# Patient Record
Sex: Male | Born: 1975 | Race: White | Hispanic: No | Marital: Married | Smoking: Current every day smoker
Health system: Southern US, Community
[De-identification: ages and names within clinical notes are randomized; demographics above are authoritative.]

## PROBLEM LIST (undated history)

## (undated) DIAGNOSIS — I10 Essential (primary) hypertension: Secondary | ICD-10-CM

## (undated) HISTORY — DX: Essential (primary) hypertension: I10

---

## 2012-10-17 ENCOUNTER — Ambulatory Visit: Payer: Self-pay | Admitting: Internal Medicine

## 2012-12-27 ENCOUNTER — Ambulatory Visit (INDEPENDENT_AMBULATORY_CARE_PROVIDER_SITE_OTHER): Payer: BC Managed Care – PPO | Admitting: Adult Health

## 2012-12-27 ENCOUNTER — Encounter: Payer: Self-pay | Admitting: Adult Health

## 2012-12-27 VITALS — BP 146/86 | HR 88 | Temp 98.4°F | Resp 12 | Ht 68.75 in | Wt 196.0 lb

## 2012-12-27 DIAGNOSIS — R030 Elevated blood-pressure reading, without diagnosis of hypertension: Secondary | ICD-10-CM | POA: Insufficient documentation

## 2012-12-27 DIAGNOSIS — Z0000 Encounter for general adult medical examination without abnormal findings: Secondary | ICD-10-CM

## 2012-12-27 DIAGNOSIS — M2550 Pain in unspecified joint: Secondary | ICD-10-CM

## 2012-12-27 NOTE — Assessment & Plan Note (Signed)
Patient with slightly elevated B/P. He has not had PCP in years. White coat syndrome (?). Continue to monitor. Check labs. Will need to follow up; however, pt reports schedule extremely busy at work and doesn't have much time. He also reports no where to check his b/p although multiple pharmacies have this service.

## 2012-12-27 NOTE — Assessment & Plan Note (Signed)
Has multiple joint aches and pains. His work is not physically active. Not currently working out. Reports of his grandmother having arthritis at a young age. Will check ANA, crp, sed rate.

## 2012-12-27 NOTE — Patient Instructions (Addendum)
   Thank you for choosing [facility] at [facility] for your health care needs.  Please schedule appointment to have your labs drawn.  The results will be available through MyChart for your convenience. Please remember to activate this. The activation code is located at the end of this form.

## 2012-12-27 NOTE — Assessment & Plan Note (Signed)
Normal physical exam except for problem listed separately. Check labs: cbc w/diff, bmet, hepatic panel and lipids.

## 2012-12-27 NOTE — Progress Notes (Signed)
  Subjective:    Patient ID: Anthony Stewart, male    DOB: 05/08/1975, 37 y.o.   MRN: [id number]  HPI  Patient presents to clinic to establish care. He has not had a PCP since he was seen by his Pediatrician. He would obtain medical care as needed at Urgent Care Clinics. He reports he is here because his wife insisted. He does report having joint pains. His work is not physical so he does not feel it is due to overuse or repetitive movements. He states that his grandmother had arthritis at a very young age (27).    Past Medical History  Diagnosis Date  . Hypertension     No past surgical history on file.   Family History  Problem Relation Age of Onset  . Family history unknown: Yes    History   Social History  . Marital Status: Married    Spouse Name: N/A    Number of Children: N/A  . Years of Education: N/A   Occupational History  . Not on file.   Social History Main Topics  . Smoking status: Former Games developer  . Smokeless tobacco: Never Used  . Alcohol Use: Yes  . Drug Use: No  . Sexual Activity: Not on file   Other Topics Concern  . Not on file   Social History Narrative  . No narrative on file    Review of Systems  Constitutional: Negative.   HENT: Negative.   Eyes: Negative.   Respiratory: Negative.   Cardiovascular: Negative.   Gastrointestinal: Negative.   Endocrine: Negative.   Genitourinary: Negative.   Musculoskeletal:       Multiple joint pains  Skin: Negative.   Allergic/Immunologic: Negative.   Neurological: Negative.   Hematological: Negative.   Psychiatric/Behavioral: Negative.     BP 146/86  Pulse 88  Temp(Src) 98.4 F (36.9 C) (Oral)  Resp 12  Ht 5' 8.75" (1.746 m)  Wt 196 lb (88.905 kg)  BMI 29.16 kg/m2  SpO2 98%    Objective:   Physical Exam  Constitutional: He is oriented to person, place, and time. He appears well-developed and well-nourished. No distress.  HENT:  Head: Normocephalic and atraumatic.  Right Ear: External  ear normal.  Left Ear: External ear normal.  Nose: Nose normal.  Mouth/Throat: Oropharynx is clear and moist.  Eyes: Conjunctivae and EOM are normal. Pupils are equal, round, and reactive to light.  Neck: Normal range of motion. Neck supple.  Cardiovascular: Normal rate, regular rhythm, normal heart sounds and intact distal pulses.   Pulmonary/Chest: Effort normal and breath sounds normal. No respiratory distress. He has no wheezes. He has no rales.  Abdominal: Soft. Bowel sounds are normal. He exhibits no distension and no mass. There is no tenderness. There is no rebound and no guarding.  Musculoskeletal: Normal range of motion.  Lymphadenopathy:    He has no cervical adenopathy.  Neurological: He is alert and oriented to person, place, and time.  Skin: Skin is warm and dry.     Psychiatric: He has a normal mood and affect. His behavior is normal. Judgment and thought content normal.        Assessment & Plan:

## 2012-12-29 ENCOUNTER — Other Ambulatory Visit (INDEPENDENT_AMBULATORY_CARE_PROVIDER_SITE_OTHER): Payer: BC Managed Care – PPO

## 2012-12-29 DIAGNOSIS — Z0000 Encounter for general adult medical examination without abnormal findings: Secondary | ICD-10-CM

## 2012-12-29 DIAGNOSIS — M2550 Pain in unspecified joint: Secondary | ICD-10-CM

## 2012-12-29 LAB — BASIC METABOLIC PANEL
BUN: 11 mg/dL (ref 6–23)
CO2: 27 mEq/L (ref 19–32)
Chloride: 106 mEq/L (ref 96–112)
Potassium: 4.4 mEq/L (ref 3.5–5.1)

## 2012-12-29 LAB — CBC WITH DIFFERENTIAL/PLATELET
Basophils Absolute: 0.1 10*3/uL (ref 0.0–0.1)
Basophils Relative: 1.2 % (ref 0.0–3.0)
Eosinophils Relative: 3.8 % (ref 0.0–5.0)
HCT: 45.3 % (ref 39.0–52.0)
Hemoglobin: 15.6 g/dL (ref 13.0–17.0)
Lymphocytes Relative: 35.4 % (ref 12.0–46.0)
Lymphs Abs: 1.7 10*3/uL (ref 0.7–4.0)
Monocytes Relative: 8.4 % (ref 3.0–12.0)
Neutro Abs: 2.5 10*3/uL (ref 1.4–7.7)
RBC: 4.98 Mil/uL (ref 4.22–5.81)
WBC: 4.9 10*3/uL (ref 4.5–10.5)

## 2012-12-29 LAB — LIPID PANEL
HDL: 59.6 mg/dL (ref >39.00)
Total CHOL/HDL Ratio: 4
VLDL: 42 mg/dL — ABNORMAL HIGH (ref 0.0–40.0)

## 2012-12-29 LAB — HEPATIC FUNCTION PANEL
Bilirubin, Direct: 0.1 mg/dL (ref 0.0–0.3)
Total Protein: 7.4 g/dL (ref 6.0–8.3)

## 2012-12-29 LAB — C-REACTIVE PROTEIN: CRP: <0.5 mg/dL (ref 0.5–20.0)

## 2012-12-29 LAB — LDL CHOLESTEROL, DIRECT: Direct LDL: 171.2 mg/dL

## 2013-01-02 ENCOUNTER — Encounter: Payer: Self-pay | Admitting: *Deleted

## 2013-02-23 ENCOUNTER — Other Ambulatory Visit: Payer: Self-pay

## 2013-03-01 ENCOUNTER — Ambulatory Visit: Payer: BC Managed Care – PPO | Admitting: Adult Health

## 2013-03-06 ENCOUNTER — Ambulatory Visit: Payer: BC Managed Care – PPO | Admitting: Adult Health

## 2013-03-09 ENCOUNTER — Encounter: Payer: Self-pay | Admitting: Adult Health

## 2013-03-09 ENCOUNTER — Ambulatory Visit (INDEPENDENT_AMBULATORY_CARE_PROVIDER_SITE_OTHER): Payer: BC Managed Care – PPO | Admitting: Adult Health

## 2013-03-09 VITALS — BP 124/80 | HR 84 | Resp 14 | Wt 199.0 lb

## 2013-03-09 DIAGNOSIS — M25511 Pain in right shoulder: Secondary | ICD-10-CM | POA: Insufficient documentation

## 2013-03-09 DIAGNOSIS — M25519 Pain in unspecified shoulder: Secondary | ICD-10-CM

## 2013-03-09 DIAGNOSIS — G8929 Other chronic pain: Secondary | ICD-10-CM | POA: Insufficient documentation

## 2013-03-09 NOTE — Progress Notes (Signed)
Pre visit review using our clinic review tool, if applicable. No additional management support is needed unless otherwise documented below in the visit note. 

## 2013-03-09 NOTE — Progress Notes (Signed)
  Subjective:    Patient ID: Anthony Stewart, male    DOB: 09/23/1975, 37 y.o.   MRN: [id number]  HPI  Pt is a pleasant 37 y/o male who presents to clinic with bil. Shoulder pain. Pain is worse with raising arms anteriorly. Does not exhibit as much pain with abduction laterally. He reports having bursitis in the past however he states this pain feels different.   Review of Systems  Musculoskeletal: Positive for arthralgias. Negative for joint swelling.       Pain bil. shoulders       Objective:   Physical Exam  Constitutional: He is oriented to person, place, and time.  Musculoskeletal: He exhibits tenderness. He exhibits no edema.  Pain anterior shoulder. Pain also with movement of raising arm anteriorly. Discomfort with abduction laterally.  Neurological: He is alert and oriented to person, place, and time.  Psychiatric: He has a normal mood and affect. His behavior is normal. Judgment and thought content normal.          Assessment & Plan:

## 2013-03-09 NOTE — Assessment & Plan Note (Signed)
?   Tendonitis. Ref to ortho.

## 2013-03-17 ENCOUNTER — Ambulatory Visit (INDEPENDENT_AMBULATORY_CARE_PROVIDER_SITE_OTHER): Payer: BC Managed Care – PPO | Admitting: Family Medicine

## 2013-03-17 ENCOUNTER — Encounter: Payer: Self-pay | Admitting: Family Medicine

## 2013-03-17 VITALS — BP 140/96 | HR 84 | Temp 98.1°F | Ht 68.75 in | Wt 197.0 lb

## 2013-03-17 DIAGNOSIS — M75100 Unspecified rotator cuff tear or rupture of unspecified shoulder, not specified as traumatic: Secondary | ICD-10-CM

## 2013-03-17 DIAGNOSIS — M5412 Radiculopathy, cervical region: Secondary | ICD-10-CM

## 2013-03-17 DIAGNOSIS — G2589 Other specified extrapyramidal and movement disorders: Secondary | ICD-10-CM

## 2013-03-17 DIAGNOSIS — M67919 Unspecified disorder of synovium and tendon, unspecified shoulder: Secondary | ICD-10-CM

## 2013-03-17 DIAGNOSIS — M5010 Cervical disc disorder with radiculopathy, unspecified cervical region: Secondary | ICD-10-CM

## 2013-03-17 DIAGNOSIS — M7542 Impingement syndrome of left shoulder: Secondary | ICD-10-CM

## 2013-03-17 DIAGNOSIS — M7582 Other shoulder lesions, left shoulder: Secondary | ICD-10-CM

## 2013-03-17 DIAGNOSIS — M7552 Bursitis of left shoulder: Secondary | ICD-10-CM

## 2013-03-17 DIAGNOSIS — R279 Unspecified lack of coordination: Secondary | ICD-10-CM

## 2013-03-17 NOTE — Progress Notes (Signed)
Pre-visit discussion using our clinic review tool. No additional management support is needed unless otherwise documented below in the visit note.  

## 2013-03-17 NOTE — Patient Instructions (Signed)
REFERRAL: GO THE THE FRONT ROOM AT THE ENTRANCE OF OUR CLINIC, NEAR CHECK IN. ASK FOR MARION. SHE WILL HELP YOU SET UP YOUR REFERRAL. DATE: TIME:   Look up on Youtube  McKenzie Protocol Cervical Spine   Advice about weightlifting with SHOULDER PROBLEMS:  1. Avoid overhead presses, military presses, clean and jerk, snatch, heavy incline bench presses, heavy bench presses, any kind of chest fly. Heavy lateral raises will hurt.  ALL OF THESE MAKE THE ROTATOR CUFF IMPINGE ON THE BONY ANATOMY ABOVE THEM AND OFTEN CAUSE RECURRENT BURSITIS AND ROTATOR CUFF TENDINITIS.  2. If you have to do bench presses then I would do the decline press and not lock out at the end of the lift. DUMBBELLS WILL TAKE STRESS OFF THE SHOULDER, SO I RECOMMEND SWITCHING FROM BAR PRESSES TO DUMBBELLS.  Continue with rotator cuff strengthening and scapular stabilizaton. Recommended that you could do all aerobic actvity.  3. All pulls, lat pull downs, pull-ups, mid-back low back, arms should be no problem. 4. Core is fine

## 2013-03-17 NOTE — Progress Notes (Signed)
Date:  03/17/2013   Name:  Anthony Stewart   DOB:  [birth year]   MRN:  [id number] Gender: male Age: 37 y.o.  Primary Physician: Orville Govern, NP  Dear Ms. Rey,  Thank you for having me see Anthony Stewart in consultation today at [facility] at [facility] for his problem with bilateral shoulder pain and radicular symptoms on the left.  As you may recall, he is a 37 y.o. year old male with a history of left greater than right shoulder pain that is ongoing now for 2 years. Over the last few months, he is also additionally started to develop some tingling, and radicular symptoms down his left arm including some numbness and tingling mostly in the first and second digit region and in the median nerve distribution on his arm.  He does not describe any particular injury. He works on Geophysical data processor for a living. He primarily is having pain when he is moving his arms and abduction, and when doing pressing movements. He does work out and Research officer, political party. He works out anywhere from 0-5 days per week. He is tried to alter his lifts in the waiting room.  He has taken some Motrin intermittently and rarely. He has not done any formal physical therapy. He has not had any prior operative interventions in the affected shoulders. He has never had any cervical spine surgery. He has never had a manipulation or other alternative treatments.   Past Medical History  Diagnosis Date  . Hypertension     No past surgical history on file.  History   Social History  . Marital Status: Married    Spouse Name: N/A    Number of Children: N/A  . Years of Education: N/A   Social History Main Topics  . Smoking status: Former Games developer  . Smokeless tobacco: Never Used  . Alcohol Use: Yes  . Drug Use: No  . Sexual Activity: None   Other Topics Concern  . None   Social History Narrative  . None    No family history on file.  Medications and Allergies reviewed  Review of Systems:    GEN: No fevers, chills.  Nontoxic. Primarily MSK c/o today. MSK: Detailed in the HPI GI: tolerating PO intake without difficulty Neuro: No numbness, parasthesias, or tingling associated. Otherwise the pertinent positives of the ROS are noted above.   Physical Examination: BP 140/96  Pulse 84  Temp(Src) 98.1 F (36.7 C) (Oral)  Ht 5' 8.75" (1.746 m)  Wt 197 lb (89.359 kg)  BMI 29.31 kg/m2    GEN: Well-developed,well-nourished,in no acute distress; alert,appropriate and cooperative throughout examination HEENT: Normocephalic and atraumatic without obvious abnormalities. Ears, externally no deformities PULM: Breathing comfortably in no respiratory distress EXT: No clubbing, cyanosis, or edema PSYCH: Normally interactive. Cooperative during the interview. Pleasant. Friendly and conversant. Not anxious or depressed appearing. Normal, full affect.  CERVICAL SPINE EXAM Range of motion: Flexion, extension, lateral bending, and rotation: Relatively preserved with some loss of motion moving leftward. Pain with terminal motion: Mildly with terminal motion, more to the left and lateral bending and rotation Spinous Processes: NT SCM: NT Upper paracervical muscles: mildly TTP Upper traps: NT C5-T1 intact, sensation and motor Spurling's test is positive to the LEFT  Shoulder: Bilateral Inspection: No muscle wasting or winging Ecchymosis/edema: neg  AC joint, scapula, clavicle: NT Abduction:  5/5, painful arc of motion and markedly altered mechanics Flexion: full, 5/5 IR, full, lift-off: 5/5 ER at neutral: full, 5/5 AC crossover: L side is  positive Neer: pos Hawkins: pos Drop Test: neg Empty Can: pos Supraspinatus insertion: mild-mod T Bicipital groove: NT Speed's: neg Yergason's: neg Sulcus sign: neg Scapular dyskinesis: Markedly altered on the left when viewed from behind in abduction. Altered to a less degree on the right compared to left. Clearly abnormal scapular mechanics. C5-T1 intact  Neuro:  Sensation intact Grip 5/5   Objective Data: No results found.  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 The patient has a markedly altered scapular mechanics which predisposed to this condition. Subacromial injection today to attempt to try to calm this down and give him some pain relief quickly. Formal physical therapy and he was given a rotator cuff and scapular stabilization protocol and progression.  Likely also nerve impingement on 1 of the left exiting nerve roots causing radicular symptoms, some tingling and some numbness. In a young person, first would like to try some traction and McKenzie protocol to see if this causes relief of his symptoms.  SubAC Injection, LEFT Verbal consent was obtained from the patient. Risks (including rare infection), benefits, and alternatives were explained. Patient prepped with Chloraprep and Ethyl Chloride used for anesthesia. The subacromial space was injected using the posterior approach. The patient tolerated the procedure well and had decreased pain post injection. No complications. Injection: 8 cc of Lidocaine 1% and 2 cc of Depo-Medrol 40 mg. 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 I told him that I would be happy to see him and work him in generally less than a week's time, and I want him to call me if he is not mostly improved in 2 or 3 months.  Thank you for having [place] see Francine Graven in consultation.  Feel free to contact me with any questions.  Signed,  Elpidio Galea. Ereka Brau, MD, CAQ Sports Medicine  [facility] [facility] at [facility] [address] [place], [place] [address] Phone: [phone] Fax: [phone]

## 2015-02-04 ENCOUNTER — Ambulatory Visit: Payer: Self-pay | Admitting: Internal Medicine

## 2015-02-05 ENCOUNTER — Ambulatory Visit: Payer: Self-pay | Admitting: Internal Medicine

## 2015-02-05 ENCOUNTER — Telehealth: Payer: Self-pay | Admitting: Internal Medicine

## 2015-02-05 DIAGNOSIS — Z0289 Encounter for other administrative examinations: Secondary | ICD-10-CM

## 2015-02-05 NOTE — Telephone Encounter (Signed)
Fyi, Pt wife called about her husband (pt) not able to make appointment because they did not get the message that was left on their voicemail. The voicemail stated that the appointment at 7am needed to be rescheduled and the new appointment was given and to call back to verify appointment. Wife did hear that message she repeated it back. Wife states that her husband came in this morning at 7am. Wife did reschedule appt to another day I explained to her that the appointment was rescheduled per provider due to her asking why the appointment was rescheduled. Wife states that we need to try and call her more than one time and until we get someone on the phone. Thank You!

## 2015-02-05 NOTE — Telephone Encounter (Signed)
I did not see him at 7am this morning when I arrived. However, fine to reschedule

## 2015-02-05 NOTE — Telephone Encounter (Signed)
FYI

## 2015-02-05 NOTE — Telephone Encounter (Signed)
Yes. I rescheduled. Thank you!

## 2015-02-26 ENCOUNTER — Encounter: Payer: Self-pay | Admitting: Internal Medicine

## 2015-02-26 ENCOUNTER — Ambulatory Visit (INDEPENDENT_AMBULATORY_CARE_PROVIDER_SITE_OTHER): Payer: BC Managed Care – PPO | Admitting: Internal Medicine

## 2015-02-26 VITALS — BP 149/96 | HR 84 | Temp 98.0°F | Ht 69.0 in | Wt 211.1 lb

## 2015-02-26 DIAGNOSIS — M25511 Pain in right shoulder: Secondary | ICD-10-CM | POA: Diagnosis not present

## 2015-02-26 DIAGNOSIS — M25512 Pain in left shoulder: Secondary | ICD-10-CM

## 2015-02-26 DIAGNOSIS — M2550 Pain in unspecified joint: Secondary | ICD-10-CM | POA: Diagnosis not present

## 2015-02-26 DIAGNOSIS — R5383 Other fatigue: Secondary | ICD-10-CM

## 2015-02-26 DIAGNOSIS — E781 Pure hyperglyceridemia: Secondary | ICD-10-CM | POA: Insufficient documentation

## 2015-02-26 DIAGNOSIS — F1090 Alcohol use, unspecified, uncomplicated: Secondary | ICD-10-CM

## 2015-02-26 DIAGNOSIS — E785 Hyperlipidemia, unspecified: Secondary | ICD-10-CM | POA: Insufficient documentation

## 2015-02-26 DIAGNOSIS — Z789 Other specified health status: Secondary | ICD-10-CM

## 2015-02-26 DIAGNOSIS — I10 Essential (primary) hypertension: Secondary | ICD-10-CM

## 2015-02-26 DIAGNOSIS — Z7289 Other problems related to lifestyle: Secondary | ICD-10-CM | POA: Insufficient documentation

## 2015-02-26 DIAGNOSIS — E782 Mixed hyperlipidemia: Secondary | ICD-10-CM | POA: Insufficient documentation

## 2015-02-26 DIAGNOSIS — D179 Benign lipomatous neoplasm, unspecified: Secondary | ICD-10-CM

## 2015-02-26 LAB — CBC WITH DIFFERENTIAL/PLATELET
BASOS PCT: 1 % (ref 0.0–3.0)
Basophils Absolute: 0.1 10*3/uL (ref 0.0–0.1)
EOS ABS: 0.3 10*3/uL (ref 0.0–0.7)
EOS PCT: 4.8 % (ref 0.0–5.0)
HEMATOCRIT: 46.8 % (ref 39.0–52.0)
HEMOGLOBIN: 15.8 g/dL (ref 13.0–17.0)
LYMPHS PCT: 24.5 % (ref 12.0–46.0)
Lymphs Abs: 1.7 10*3/uL (ref 0.7–4.0)
MCHC: 33.8 g/dL (ref 30.0–36.0)
MCV: 91 fl (ref 78.0–100.0)
Monocytes Absolute: 0.6 10*3/uL (ref 0.1–1.0)
Monocytes Relative: 9 % (ref 3.0–12.0)
Neutro Abs: 4.1 10*3/uL (ref 1.4–7.7)
Neutrophils Relative %: 60.7 % (ref 43.0–77.0)
Platelets: 324 10*3/uL (ref 150.0–400.0)
RBC: 5.14 Mil/uL (ref 4.22–5.81)
RDW: 13.1 % (ref 11.5–15.5)
WBC: 6.8 10*3/uL (ref 4.0–10.5)

## 2015-02-26 LAB — LIPID PANEL
CHOL/HDL RATIO: 5
CHOLESTEROL: 251 mg/dL — AB (ref 0–200)
HDL: 51.7 mg/dL (ref >39.00)
NonHDL: 199.7
TRIGLYCERIDES: 300 mg/dL — AB (ref 0.0–149.0)
VLDL: 60 mg/dL — ABNORMAL HIGH (ref 0.0–40.0)

## 2015-02-26 LAB — COMPREHENSIVE METABOLIC PANEL
ALBUMIN: 4.7 g/dL (ref 3.5–5.2)
ALK PHOS: 59 U/L (ref 39–117)
ALT: 31 U/L (ref 0–53)
AST: 27 U/L (ref 0–37)
BUN: 15 mg/dL (ref 6–23)
CALCIUM: 10.1 mg/dL (ref 8.4–10.5)
CHLORIDE: 100 meq/L (ref 96–112)
CO2: 29 mEq/L (ref 19–32)
CREATININE: 0.9 mg/dL (ref 0.40–1.50)
GFR: 100.83 mL/min (ref >60.00)
Glucose, Bld: 96 mg/dL (ref 70–99)
Potassium: 3.8 mEq/L (ref 3.5–5.1)
Sodium: 138 mEq/L (ref 135–145)
TOTAL PROTEIN: 7.8 g/dL (ref 6.0–8.3)
Total Bilirubin: 0.9 mg/dL (ref 0.2–1.2)

## 2015-02-26 LAB — VITAMIN B12: Vitamin B-12: 406 pg/mL (ref 211–911)

## 2015-02-26 LAB — LDL CHOLESTEROL, DIRECT: Direct LDL: 160 mg/dL

## 2015-02-26 LAB — TSH: TSH: 1.74 u[IU]/mL (ref 0.35–4.50)

## 2015-02-26 MED ORDER — AMLODIPINE BESYLATE 5 MG PO TABS: 5.0000 mg | ORAL_TABLET | Freq: Every day | ORAL | Status: DC

## 2015-02-26 MED ORDER — MELOXICAM 15 MG PO TABS: 15.0000 mg | ORAL_TABLET | Freq: Every day | ORAL | Status: DC

## 2015-02-26 NOTE — Assessment & Plan Note (Signed)
BP Readings from Last 3 Encounters:  02/26/15 149/96  03/17/13 140/96  03/09/13 124/80   BP continues to be elevated. Start Amlodipine 5mg  daily. Follow up recheck in 4 weeks. EKG today normal.

## 2015-02-26 NOTE — Progress Notes (Signed)
Subjective:    Patient ID: Anthony Stewart, male    DOB: [birth year], 39 y.o.   MRN: [id number]  HPI  40YO male presents to establish care. Previously seen by Raquel Rey in this office.  Bilateral shoulder pain - ongoing for years, however pain has improved over years. Not anything for pain except for occasional Goody powder or ibuprofen. No PT.  HTN - Does not check BP. Feels tired at times. No CP, HA, palpitations.  Feels generally tired. Sleeps well at night.  Denies depression or anxiety. However feels he is just moving through life, working and caring for kids. Does not get joy out of life activities. Feels he does not have time for exercise. Drinks 6-8 beers at night to "numb" joint pain.   Wt Readings from Last 3 Encounters:  02/26/15 211 lb 2 oz (95.766 kg)  03/17/13 197 lb (89.359 kg)  03/09/13 199 lb (90.266 kg)   BP Readings from Last 3 Encounters:  02/26/15 149/96  03/17/13 140/96  03/09/13 124/80    Past Medical History  Diagnosis Date  . Hypertension    History reviewed. No pertinent family history. History reviewed. No pertinent past surgical history. Social History   Social History  . Marital Status: Married    Spouse Name: N/A  . Number of Children: N/A  . Years of Education: N/A   Social History Main Topics  . Smoking status: Former Research scientist (life sciences)  . Smokeless tobacco: Never Used  . Alcohol Use: Yes     Comment: 4-6 beers per night.  . Drug Use: No  . Sexual Activity: Not Asked   Other Topics Concern  . None   Social History Narrative   Lives in [place] with wife.      Work - Development worker, community          Review of Systems  Constitutional: Negative for fever, chills, activity change, appetite change, fatigue and unexpected weight change.  Eyes: Negative for visual disturbance.  Respiratory: Negative for cough and shortness of breath.   Cardiovascular: Negative for chest pain, palpitations and leg swelling.  Gastrointestinal: Negative for nausea, vomiting,  abdominal pain, diarrhea, constipation and abdominal distention.  Genitourinary: Negative for dysuria, urgency and difficulty urinating.  Musculoskeletal: Positive for myalgias and arthralgias. Negative for joint swelling and gait problem.  Skin: Negative for color change and rash.  Hematological: Negative for adenopathy. Does not bruise/bleed easily.  Psychiatric/Behavioral: Negative for suicidal ideas, sleep disturbance and dysphoric mood. The patient is not nervous/anxious.        Objective:    BP 149/96 mmHg  Pulse 84  Temp(Src) 98 F (36.7 C) (Oral)  Ht 5\' 9"  (1.753 m)  Wt 211 lb 2 oz (95.766 kg)  BMI 31.16 kg/m2  SpO2 98% Physical Exam  Constitutional: He is oriented to person, place, and time. He appears well-developed and well-nourished. No distress.  HENT:  Head: Normocephalic and atraumatic.  Right Ear: External ear normal.  Left Ear: External ear normal.  Nose: Nose normal.  Mouth/Throat: Oropharynx is clear and moist. No oropharyngeal exudate.  Eyes: Conjunctivae and EOM are normal. Pupils are equal, round, and reactive to light. Right eye exhibits no discharge. Left eye exhibits no discharge. No scleral icterus.  Neck: Normal range of motion. Neck supple. No tracheal deviation present. No thyromegaly present.  Cardiovascular: Normal rate, regular rhythm and normal heart sounds.  Exam reveals no gallop and no friction rub.   No murmur heard. Pulmonary/Chest: Effort normal and breath sounds normal. No accessory  muscle usage. No tachypnea. No respiratory distress. He has no decreased breath sounds. He has no wheezes. He has no rhonchi. He has no rales. He exhibits no tenderness.  Musculoskeletal: Normal range of motion. He exhibits no edema.  Lymphadenopathy:    He has no cervical adenopathy.  Neurological: He is alert and oriented to person, place, and time. No cranial nerve deficit. Coordination normal.  Skin: Skin is warm and dry. No rash noted. He is not diaphoretic.  No erythema. No pallor.     Psychiatric: Judgment and thought content normal. His mood appears anxious. His speech is rapid and/or pressured. He is agitated. Cognition and memory are normal. He exhibits a depressed mood.          Assessment & Plan:  Over 55min of which >50% spent in face-to-face contact with patient discussing plan of care  Problem List Items Addressed This Visit      Unprioritized   Alcohol use ([place])    Encouraged him to limit use of alcohol. He denies any issues with alcohol, however reported drinking 6-8 beers per night.      Arthralgia    Diffuse arthralgia. Previous evaluation for autoimmune mediated arthritis negative. Most consistent with OA. Will start Meloxicam 15mg  daily. Discussed potential risks of this medication. Follow up 4 weeks and prn.      Relevant Medications   meloxicam (MOBIC) 15 MG tablet   Other Relevant Orders   CBC with Differential/Platelet   Bilateral shoulder pain    Chronic bilateral shoulder pain. Exam today normal. Will set up evaluation with Sports Medicine. Start Meloxicam 15mg  daily. Follow up here in 4 weeks.      Relevant Orders   Ambulatory referral to Sports Medicine   Essential hypertension - Primary    BP Readings from Last 3 Encounters:  02/26/15 149/96  03/17/13 140/96  03/09/13 124/80   BP continues to be elevated. Start Amlodipine 5mg  daily. Follow up recheck in 4 weeks. EKG today normal.      Relevant Medications   amLODipine (NORVASC) 5 MG tablet   Other Relevant Orders   Comprehensive metabolic panel   EKG 76-KGSU (Completed)   Hyperlipidemia    Repeat lipids with labs today.      Relevant Medications   amLODipine (NORVASC) 5 MG tablet   Other Relevant Orders   Lipid panel   Lipoma    Discussed the benign nature of lipomas. He would like to have these removed for cosmetic reasons. Will set up general surgery evaluation.      Relevant Orders   Ambulatory referral to General Surgery   Other  fatigue    Generalized fatigue. Suspect anxiety/depression playing a role, however he denies this. Encouraged him to limit alcohol use. Will check labs including CBC, TSH, B12.      Relevant Orders   TSH   B12       Return in about 4 weeks (around 03/26/2015) for Recheck.

## 2015-02-26 NOTE — Assessment & Plan Note (Signed)
>>  ASSESSMENT AND PLAN FOR BILATERAL SHOULDER PAIN WRITTEN ON 02/26/2015  8:48 AM BY Dan Humphreys, JENNIFER A, MD  Chronic bilateral shoulder pain. Exam today normal. Will set up evaluation with Sports Medicine. Start Meloxicam 15mg  daily. Follow up here in 4 weeks.

## 2015-02-26 NOTE — Assessment & Plan Note (Signed)
- 

## 2015-02-26 NOTE — Patient Instructions (Addendum)
Start Meloxicam 15mg  daily to help with joint pain.  Do not take Ibuprofen with this.  We will set up evaluation with Dr. Tamala Julian in Sports Medicine.  Start Amlodipine 5mg  daily to help lower blood pressure.  Labs today.   We will also set up evaluation with Dr. Bary Castilla to evaluate lipomas.

## 2015-02-26 NOTE — Progress Notes (Signed)
Pre visit review using our clinic review tool, if applicable. No additional management support is needed unless otherwise documented below in the visit note. 

## 2015-02-26 NOTE — Assessment & Plan Note (Signed)
Encouraged him to limit use of alcohol. He denies any issues with alcohol, however reported drinking 6-8 beers per night.

## 2015-02-26 NOTE — Assessment & Plan Note (Signed)
Chronic bilateral shoulder pain. Exam today normal. Will set up evaluation with Sports Medicine. Start Meloxicam 15mg  daily. Follow up here in 4 weeks.

## 2015-02-26 NOTE — Assessment & Plan Note (Signed)
Generalized fatigue. Suspect anxiety/depression playing a role, however he denies this. Encouraged him to limit alcohol use. Will check labs including CBC, TSH, B12.

## 2015-02-26 NOTE — Assessment & Plan Note (Signed)
Diffuse arthralgia. Previous evaluation for autoimmune mediated arthritis negative. Most consistent with OA. Will start Meloxicam 15mg  daily. Discussed potential risks of this medication. Follow up 4 weeks and prn.

## 2015-02-26 NOTE — Assessment & Plan Note (Signed)
Discussed the benign nature of lipomas. He would like to have these removed for cosmetic reasons. Will set up general surgery evaluation.

## 2015-02-27 ENCOUNTER — Encounter: Payer: Self-pay | Admitting: Internal Medicine

## 2015-03-19 ENCOUNTER — Ambulatory Visit (INDEPENDENT_AMBULATORY_CARE_PROVIDER_SITE_OTHER): Payer: BC Managed Care – PPO | Admitting: Family Medicine

## 2015-03-19 ENCOUNTER — Encounter: Payer: Self-pay | Admitting: Family Medicine

## 2015-03-19 VITALS — BP 132/86 | HR 84 | Ht 69.0 in | Wt 208.0 lb

## 2015-03-19 DIAGNOSIS — M2550 Pain in unspecified joint: Secondary | ICD-10-CM

## 2015-03-19 DIAGNOSIS — M25511 Pain in right shoulder: Secondary | ICD-10-CM | POA: Diagnosis not present

## 2015-03-19 DIAGNOSIS — M25512 Pain in left shoulder: Secondary | ICD-10-CM | POA: Diagnosis not present

## 2015-03-19 MED ORDER — ALLOPURINOL 300 MG PO TABS: 300.0000 mg | ORAL_TABLET | Freq: Every day | ORAL | Status: DC

## 2015-03-19 MED ORDER — DOXYCYCLINE HYCLATE 100 MG PO TABS: 100.0000 mg | ORAL_TABLET | Freq: Two times a day (BID) | ORAL | Status: AC

## 2015-03-19 NOTE — Progress Notes (Signed)
[facility] [place], [place] [address] Phone: [phone] Subjective:    I'm seeing this patient by the request  of:  Anthony Mast, MD   CC: Bilateral shoulder and multiple arthralgias  [id number] [name] is a 39 y.o. male coming in with complaint of bilateral shoulder pain. Patient is also having significant other joint pains. Seems to be intermittent. Sometimes certain joints hurt more than the others. Patient states though that the shoulder pain seems to be chronic. Seems to be more of a dull, throbbing aching sensation. May have been associated with some type of injury when he was weightlifting. Continues to have pain and then days when he feels significant a better. When pain is bad it can be 8 out of 10. No radiation or weakness. States sometimes can have some mild redness of the joints but nothing this seems to stay around. Denies any fever, chills, or any abnormal weight loss. Patient states though that the pain is bad enough that he can stop him from activity or even working. Patient's [place] care provider and was given oral anti-inflammatory which may be has helped minorly.  Past Medical History  Diagnosis Date  . Hypertension    No past surgical history on file. Social History  Substance Use Topics  . Smoking status: Former Research scientist (life sciences)  . Smokeless tobacco: Never Used  . Alcohol Use: Yes     Comment: 4-6 beers per night.   No Known Allergies No family history on file.      Past medical history, social, surgical and family history all reviewed in electronic medical record.   Review of Systems: No headache, visual changes, nausea, vomiting, diarrhea, constipation, dizziness, abdominal pain, skin rash, fevers, chills, night sweats, weight loss, swollen lymph nodes, body aches, joint swelling, muscle aches, chest pain, shortness of breath, mood changes.   Objective Blood pressure 132/86, pulse 84, height 5\' 9"   (1.753 m), weight 208 lb (94.348 kg), SpO2 98 %.  General: No apparent distress alert and oriented x3 mood and affect normal, dressed appropriately.  HEENT: Pupils equal, extraocular movements intact  Respiratory: Patient's speak in full sentences and does not appear short of breath  Cardiovascular: No lower extremity edema, non tender, no erythema  Skin: Warm dry intact with no signs of infection or rash on extremities or on axial skeleton.  Abdomen: Soft nontender  Neuro: Cranial nerves II through XII are intact, neurovascularly intact in all extremities with 2+ DTRs and 2+ pulses.  Lymph: No lymphadenopathy of posterior or anterior cervical chain or axillae bilaterally.  Gait normal with good balance and coordination.  MSK:  Non tender with full range of motion and good stability and symmetric strength and tone of  elbows, wrist, hip, knee and ankles bilaterally.  Shoulder: Right Inspection reveals no abnormalities, atrophy or asymmetry. Palpation is normal with no tenderness over AC joint or bicipital groove. ROM is full in all planes. Rotator cuff strength normal throughout. Positive impingement signs Speeds and Yergason's tests normal. No labral pathology noted with negative Obrien's, negative clunk and good stability. Normal scapular function observed. No painful arc and no drop arm sign. No apprehension sign Hunter lateral shoulder also has some mild impingement signs   MSK US performed of: Right shoulder This study was ordered, performed, and interpreted by Charlann Boxer D.O.  Shoulder:   Supraspinatus:  Patient does have a tear at one point and does have some scar tissue. Uric acid deposits noted as well.  Mild hypoechoic changes and increasing Doppler flow noted. Infraspinatus:  Appears normal on long and transverse views. Subscapularis:  Appears normal on long and transverse views. Teres Minor:  Appears normal on long and transverse views. AC joint: Likely uric acid deposits  noted Glenohumeral Joint:  Mild arthritic changes noted Glenoid Labrum:  Intact without visualized tears. Biceps Tendon:  Appears normal on long and transverse views, no fraying of tendon, tendon located in intertubercular groove, no subluxation with shoulder internal or external rotation. No increased power doppler signal. 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

## 2015-03-19 NOTE — Progress Notes (Signed)
Pre visit review using our clinic review tool, if applicable. No additional management support is needed unless otherwise documented below in the visit note. 

## 2015-03-19 NOTE — Patient Instructions (Addendum)
Good to see you.  Ice 20 minutes 2 times daily. Usually after activity and before bed. Exercises 3 times a week.  Continue the meloxicam.  Vitamin D 2000 IU daily B12 1045mcg daily Tumeric 500mg  twice daily Allopurinol 300mg  daily to help with the gout Doxycycline 100mg  2 times dialy for next 3 weeks.  See me again in 2 weeks and if not better than come back and we will do some labs and inject the shoulder.  Happy holidays!    Gout Gout is an inflammatory arthritis caused by a buildup of uric acid crystals in the joints. Uric acid is a chemical that is normally present in the blood. When the level of uric acid in the blood is too high it can form crystals that deposit in your joints and tissues. This causes joint redness, soreness, and swelling (inflammation). Repeat attacks are common. Over time, uric acid crystals can form into masses (tophi) near a joint, destroying bone and causing disfigurement. Gout is treatable and often preventable. CAUSES  The disease begins with elevated levels of uric acid in the blood. Uric acid is produced by your body when it breaks down a naturally found substance called purines. Certain foods you eat, such as meats and fish, contain high amounts of purines. Causes of an elevated uric acid level include:  Being passed down from parent to child (heredity).  Diseases that cause increased uric acid production (such as obesity, psoriasis, and certain cancers).  Excessive alcohol use.  Diet, especially diets rich in meat and seafood.  Medicines, including certain cancer-fighting medicines (chemotherapy), water pills (diuretics), and aspirin.  Chronic kidney disease. The kidneys are no longer able to remove uric acid well.  Problems with metabolism. Conditions strongly associated with gout include:  Obesity.  High blood pressure.  High cholesterol.  Diabetes. Not everyone with elevated uric acid levels gets gout. It is not understood why some people  get gout and others do not. Surgery, joint injury, and eating too much of certain foods are some of the factors that can lead to gout attacks. SYMPTOMS   An attack of gout comes on quickly. It causes intense pain with redness, swelling, and warmth in a joint.  Fever can occur.  Often, only one joint is involved. Certain joints are more commonly involved:  Base of the big toe.  Knee.  Ankle.  Wrist.  Finger. Without treatment, an attack usually goes away in a few days to weeks. Between attacks, you usually will not have symptoms, which is different from many other forms of arthritis. DIAGNOSIS  Your caregiver will suspect gout based on your symptoms and exam. In some cases, tests may be recommended. The tests may include:  Blood tests.  Urine tests.  X-rays.  Joint fluid exam. This exam requires a needle to remove fluid from the joint (arthrocentesis). Using a microscope, gout is confirmed when uric acid crystals are seen in the joint fluid. TREATMENT  There are two phases to gout treatment: treating the sudden onset (acute) attack and preventing attacks (prophylaxis).  Treatment of an Acute Attack.  Medicines are used. These include anti-inflammatory medicines or steroid medicines.  An injection of steroid medicine into the affected joint is sometimes necessary.  The painful joint is rested. Movement can worsen the arthritis.  You may use warm or cold treatments on painful joints, depending which works best for you.  Treatment to Prevent Attacks.  If you suffer from frequent gout attacks, your caregiver may advise preventive medicine. These  medicines are started after the acute attack subsides. These medicines either help your kidneys eliminate uric acid from your body or decrease your uric acid production. You may need to stay on these medicines for a very long time.  The early phase of treatment with preventive medicine can be associated with an increase in acute gout  attacks. For this reason, during the first few months of treatment, your caregiver may also advise you to take medicines usually used for acute gout treatment. Be sure you understand your caregiver's directions. Your caregiver may make several adjustments to your medicine dose before these medicines are effective.  Discuss dietary treatment with your caregiver or dietitian. Alcohol and drinks high in sugar and fructose and foods such as meat, poultry, and seafood can increase uric acid levels. Your caregiver or dietitian can advise you on drinks and foods that should be limited. HOME CARE INSTRUCTIONS   Do not take aspirin to relieve pain. This raises uric acid levels.  Only take over-the-counter or prescription medicines for pain, discomfort, or fever as directed by your caregiver.  Rest the joint as much as possible. When in bed, keep sheets and blankets off painful areas.  Keep the affected joint raised (elevated).  Apply warm or cold treatments to painful joints. Use of warm or cold treatments depends on which works best for you.  Use crutches if the painful joint is in your leg.  Drink enough fluids to keep your urine clear or pale yellow. This helps your body get rid of uric acid. Limit alcohol, sugary drinks, and fructose drinks.  Follow your dietary instructions. Pay careful attention to the amount of protein you eat. Your daily diet should emphasize fruits, vegetables, whole grains, and fat-free or low-fat milk products. Discuss the use of coffee, vitamin C, and cherries with your caregiver or dietitian. These may be helpful in lowering uric acid levels.  Maintain a healthy body weight. SEEK MEDICAL CARE IF:   You develop diarrhea, vomiting, or any side effects from medicines.  You do not feel better in 24 hours, or you are getting worse. SEEK IMMEDIATE MEDICAL CARE IF:   Your joint becomes suddenly more tender, and you have chills or a fever. MAKE SURE YOU:   Understand  these instructions.  Will watch your condition.  Will get help right away if you are not doing well or get worse.   This information is not intended to replace advice given to you by your health care provider. Make sure you discuss any questions you have with your health care provider.   Document Released: 04/03/2000 Document Revised: 04/27/2014 Document Reviewed: 11/18/2011 Elsevier Interactive Patient Education [employer].

## 2015-03-19 NOTE — Assessment & Plan Note (Signed)
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 Depending worsening symptoms we will consider injection in 2 weeks at follow-up.

## 2015-03-19 NOTE — Assessment & Plan Note (Signed)
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 Depending worsening symptoms we will consider injection in 2 weeks at follow-up.

## 2015-03-19 NOTE — Assessment & Plan Note (Signed)
Difficult to assess. On ultrasound today and patient shoulder that seems to be possible uric acid deposits. Patient does not have a family history of this though. We did discuss possibly Asnis. We discussed possibly getting labs which patient declined at this time. Instead we'll try to treat with allopurinol. Warned the potential side effects. Differential also includes Lyme disease and patient states that before all this 3 years ago patient did have a take bite. Was not treated. Did have a central clearing on his leg. Last in multiple months. Patient states that he is still able to do daily activities just has pain from time to time. Autoimmune diseases can be within differential. Patient will come back and see me again in 2-3 weeks. Continued have pain I would like to do further workup including labs

## 2015-03-27 ENCOUNTER — Ambulatory Visit: Payer: BC Managed Care – PPO | Admitting: Internal Medicine

## 2015-04-04 ENCOUNTER — Ambulatory Visit: Payer: BC Managed Care – PPO | Admitting: Family Medicine

## 2015-04-29 ENCOUNTER — Ambulatory Visit: Payer: BC Managed Care – PPO | Admitting: Internal Medicine

## 2015-04-30 ENCOUNTER — Telehealth: Payer: Self-pay | Admitting: *Deleted

## 2015-04-30 ENCOUNTER — Other Ambulatory Visit: Payer: Self-pay | Admitting: Internal Medicine

## 2015-04-30 DIAGNOSIS — M2550 Pain in unspecified joint: Secondary | ICD-10-CM

## 2015-04-30 MED ORDER — MELOXICAM 15 MG PO TABS: 15.0000 mg | ORAL_TABLET | Freq: Every day | ORAL | Status: DC

## 2015-04-30 NOTE — Telephone Encounter (Signed)
Patient requested authorization for meloxicam. Please use [facility] in [place].

## 2015-04-30 NOTE — Telephone Encounter (Signed)
Request sent to Dr.Walker to approve.

## 2015-04-30 NOTE — Telephone Encounter (Signed)
Please advise? Last refill in November.

## 2015-05-16 ENCOUNTER — Ambulatory Visit (INDEPENDENT_AMBULATORY_CARE_PROVIDER_SITE_OTHER): Payer: BC Managed Care – PPO | Admitting: Internal Medicine

## 2015-05-16 ENCOUNTER — Encounter: Payer: Self-pay | Admitting: Internal Medicine

## 2015-05-16 VITALS — BP 125/81 | HR 83 | Temp 98.3°F | Ht 69.0 in | Wt 209.4 lb

## 2015-05-16 DIAGNOSIS — R5383 Other fatigue: Secondary | ICD-10-CM | POA: Diagnosis not present

## 2015-05-16 DIAGNOSIS — M2550 Pain in unspecified joint: Secondary | ICD-10-CM

## 2015-05-16 LAB — COMPREHENSIVE METABOLIC PANEL
ALBUMIN: 4.6 g/dL (ref 3.5–5.2)
ALK PHOS: 53 U/L (ref 39–117)
ALT: 41 U/L (ref 0–53)
AST: 33 U/L (ref 0–37)
BUN: 13 mg/dL (ref 6–23)
CALCIUM: 9.5 mg/dL (ref 8.4–10.5)
CHLORIDE: 103 meq/L (ref 96–112)
CO2: 29 mEq/L (ref 19–32)
CREATININE: 0.94 mg/dL (ref 0.40–1.50)
GFR: 95.79 mL/min (ref >60.00)
Glucose, Bld: 108 mg/dL — ABNORMAL HIGH (ref 70–99)
POTASSIUM: 3.9 meq/L (ref 3.5–5.1)
SODIUM: 140 meq/L (ref 135–145)
TOTAL PROTEIN: 7.6 g/dL (ref 6.0–8.3)
Total Bilirubin: 0.6 mg/dL (ref 0.2–1.2)

## 2015-05-16 LAB — RHEUMATOID FACTOR: Rhuematoid fact SerPl-aCnc: 10 IU/mL (ref <=14)

## 2015-05-16 LAB — SEDIMENTATION RATE: Sed Rate: 9 mm/hr (ref 0–22)

## 2015-05-16 LAB — URIC ACID: URIC ACID, SERUM: 5.4 mg/dL (ref 4.0–7.8)

## 2015-05-16 LAB — C-REACTIVE PROTEIN: CRP: 0.4 mg/dL — ABNORMAL LOW (ref 0.5–20.0)

## 2015-05-16 NOTE — Patient Instructions (Signed)
Labs today.  Follow up in 6 months. 

## 2015-05-16 NOTE — Progress Notes (Signed)
Pre visit review using our clinic review tool, if applicable. No additional management support is needed unless otherwise documented below in the visit note. 

## 2015-05-16 NOTE — Progress Notes (Signed)
Subjective:    Patient ID: Anthony Stewart, male    DOB: 1976-02-06, 40 y.o.   MRN: [id number]  HPI  40YO male presents for follow up.  Seen in 02/2015 for fatigue and arthralgia. Referred to Dr. Tamala Julian.  Started on Allopurinol for possible gout. Also treated for possible lyme with doxycycline.   Continues to have pain in shoulders, however improved to initially. Taking Meloxicam, with some improvement in back pain but not shoulder pain.  Fatigue resolved after taking Doxycycline.   Wt Readings from Last 3 Encounters:  05/16/15 209 lb 6 oz (94.972 kg)  03/19/15 208 lb (94.348 kg)  02/26/15 211 lb 2 oz (95.766 kg)   BP Readings from Last 3 Encounters:  05/16/15 125/81  03/19/15 132/86  02/26/15 149/96    Past Medical History  Diagnosis Date  . Hypertension    No family history on file. No past surgical history on file. Social History   Social History  . Marital Status: Married    Spouse Name: N/A  . Number of Children: N/A  . Years of Education: N/A   Social History Main Topics  . Smoking status: Former Research scientist (life sciences)  . Smokeless tobacco: Never Used  . Alcohol Use: Yes     Comment: 4-6 beers per night.  . Drug Use: No  . Sexual Activity: Not Asked   Other Topics Concern  . None   Social History Narrative   Lives in [place] with wife.      Work - Development worker, community          Review of Systems  Constitutional: Negative for fever, chills, activity change, appetite change, fatigue and unexpected weight change.  Eyes: Negative for visual disturbance.  Respiratory: Negative for cough and shortness of breath.   Cardiovascular: Negative for chest pain, palpitations and leg swelling.  Gastrointestinal: Negative for nausea, vomiting, abdominal pain, diarrhea, constipation and abdominal distention.  Genitourinary: Negative for dysuria, urgency and difficulty urinating.  Musculoskeletal: Positive for myalgias and arthralgias. Negative for gait problem.  Skin: Negative for color  change and rash.  Hematological: Negative for adenopathy.  Psychiatric/Behavioral: Negative for suicidal ideas, sleep disturbance and dysphoric mood. The patient is not nervous/anxious.        Objective:    BP 125/81 mmHg  Pulse 83  Temp(Src) 98.3 F (36.8 C) (Oral)  Ht '5\' 9"'  (1.753 m)  Wt 209 lb 6 oz (94.972 kg)  BMI 30.91 kg/m2  SpO2 98% Physical Exam  Constitutional: He is oriented to person, place, and time. He appears well-developed and well-nourished. No distress.  HENT:  Head: Normocephalic and atraumatic.  Right Ear: External ear normal.  Left Ear: External ear normal.  Nose: Nose normal.  Mouth/Throat: Oropharynx is clear and moist. No oropharyngeal exudate.  Eyes: Conjunctivae and EOM are normal. Pupils are equal, round, and reactive to light. Right eye exhibits no discharge. Left eye exhibits no discharge. No scleral icterus.  Neck: Normal range of motion. Neck supple. No tracheal deviation present. No thyromegaly present.  Cardiovascular: Normal rate, regular rhythm and normal heart sounds.  Exam reveals no gallop and no friction rub.   No murmur heard. Pulmonary/Chest: Effort normal and breath sounds normal. No accessory muscle usage. No tachypnea. No respiratory distress. He has no decreased breath sounds. He has no wheezes. He has no rhonchi. He has no rales. He exhibits no tenderness.  Musculoskeletal: Normal range of motion. He exhibits no edema.  Lymphadenopathy:    He has no cervical adenopathy.  Neurological: He  is alert and oriented to person, place, and time. No cranial nerve deficit. Coordination normal.  Skin: Skin is warm and dry. No rash noted. He is not diaphoretic. No erythema. No pallor.  Psychiatric: He has a normal mood and affect. His behavior is normal. Judgment and thought content normal.          Assessment & Plan:   Problem List Items Addressed This Visit      Unprioritized   Arthralgia - Primary    Symptoms improved with Meloxicam  and treatment with Doxycycline. Will send testing for autoimmune arthropathy today and for Lyme infection.       Relevant Orders   Comprehensive metabolic panel   Antinuclear Antib (ANA)   Sed Rate (ESR)   C-reactive protein   Lyme Ab/Western Blot Reflex   Rheumatoid Factor   Uric acid   Other fatigue    Symptoms have improved. Will continue to monitor.          Return in about 6 months (around 11/13/2015) for Recheck.

## 2015-05-16 NOTE — Assessment & Plan Note (Signed)
Symptoms improved with Meloxicam and treatment with Doxycycline. Will send testing for autoimmune arthropathy today and for Lyme infection.

## 2015-05-16 NOTE — Assessment & Plan Note (Signed)
Symptoms have improved. Will continue to monitor.

## 2015-05-17 LAB — ANA: ANA: NEGATIVE

## 2015-05-17 LAB — LYME AB/WESTERN BLOT REFLEX: B BURGDORFERI AB IGG+ IGM: 0.02 {ISR}

## 2015-05-27 ENCOUNTER — Other Ambulatory Visit: Payer: Self-pay | Admitting: Internal Medicine

## 2015-06-24 ENCOUNTER — Telehealth: Payer: Self-pay

## 2015-06-24 NOTE — Telephone Encounter (Signed)
Notified the pt of test results, pt verbalized understanding. Pt is c/o still having pain in his joints and wants to know what he should do at this point. Pt is questioning whether or not he needs to be referred to a specialist. Please advise, thanks

## 2015-06-24 NOTE — Telephone Encounter (Signed)
We should set up a follow up to discuss 29min

## 2015-06-25 NOTE — Telephone Encounter (Signed)
LMOMTCB

## 2015-06-26 NOTE — Telephone Encounter (Signed)
Appt has been made for 07/05/15 at 4pm

## 2015-07-05 ENCOUNTER — Ambulatory Visit
Admission: RE | Admit: 2015-07-05 | Discharge: 2015-07-05 | Disposition: A | Discharge status: Home / Self Care | Payer: BC Managed Care – PPO | Source: Ambulatory Visit | Attending: Internal Medicine | Admitting: Internal Medicine

## 2015-07-05 ENCOUNTER — Other Ambulatory Visit: Payer: Self-pay | Admitting: Internal Medicine

## 2015-07-05 ENCOUNTER — Encounter: Payer: Self-pay | Admitting: Internal Medicine

## 2015-07-05 ENCOUNTER — Ambulatory Visit (INDEPENDENT_AMBULATORY_CARE_PROVIDER_SITE_OTHER): Payer: BC Managed Care – PPO | Admitting: Internal Medicine

## 2015-07-05 VITALS — BP 147/80 | HR 82 | Temp 98.2°F | Ht 69.0 in | Wt 210.1 lb

## 2015-07-05 DIAGNOSIS — X58XXXD Exposure to other specified factors, subsequent encounter: Secondary | ICD-10-CM | POA: Insufficient documentation

## 2015-07-05 DIAGNOSIS — M2550 Pain in unspecified joint: Secondary | ICD-10-CM

## 2015-07-05 DIAGNOSIS — S52046D Nondisplaced fracture of coronoid process of unspecified ulna, subsequent encounter for closed fracture with routine healing: Secondary | ICD-10-CM | POA: Diagnosis not present

## 2015-07-05 DIAGNOSIS — S52202A Unspecified fracture of shaft of left ulna, initial encounter for closed fracture: Secondary | ICD-10-CM

## 2015-07-05 MED ORDER — TRAMADOL HCL 50 MG PO TABS: 50.0000 mg | ORAL_TABLET | Freq: Three times a day (TID) | ORAL | Status: DC | PRN

## 2015-07-05 NOTE — Progress Notes (Signed)
Subjective:    Patient ID: Anthony Stewart, male    DOB: 09/22/1975, 40 y.o.   MRN: [id number]  HPI  40YO male presents for acute visit.  Joint pain - Developed worsening ankle and both elbows over last few weeks. Not constant pain. Described as hot pain. No swelling noted. No change in ROM. No new activities noted.  He has had similar symptoms in the past and workup including labs unremarkable. He reports being treated for "Lyme disease" in the past with improvement in symptoms, however recent testing for lyme was negative. He is taking Meloxicam daily with little improvement.  Wt Readings from Last 3 Encounters:  07/05/15 210 lb 2 oz (95.312 kg)  05/16/15 209 lb 6 oz (94.972 kg)  03/19/15 208 lb (94.348 kg)   BP Readings from Last 3 Encounters:  07/05/15 147/80  05/16/15 125/81  03/19/15 132/86    Past Medical History  Diagnosis Date  . Hypertension    No family history on file. No past surgical history on file. Social History   Social History  . Marital Status: Married    Spouse Name: N/A  . Number of Children: N/A  . Years of Education: N/A   Social History Main Topics  . Smoking status: Former Research scientist (life sciences)  . Smokeless tobacco: Never Used  . Alcohol Use: Yes     Comment: 4-6 beers per night.  . Drug Use: No  . Sexual Activity: Not Asked   Other Topics Concern  . None   Social History Narrative   Lives in [place] with wife.      Work - Development worker, community          Review of Systems  Constitutional: Negative for fever, chills, activity change, appetite change, fatigue and unexpected weight change.  Eyes: Negative for visual disturbance.  Respiratory: Negative for cough and shortness of breath.   Cardiovascular: Negative for chest pain, palpitations and leg swelling.  Gastrointestinal: Negative for abdominal pain and abdominal distention.  Genitourinary: Negative for dysuria, urgency and difficulty urinating.  Musculoskeletal: Positive for arthralgias. Negative for  joint swelling and gait problem.  Skin: Negative for color change and rash.  Hematological: Negative for adenopathy.  Psychiatric/Behavioral: Negative for sleep disturbance and dysphoric mood. The patient is not nervous/anxious.        Objective:    BP 147/80 mmHg  Pulse 82  Temp(Src) 98.2 F (36.8 C) (Oral)  Ht 5\' 9"  (1.753 m)  Wt 210 lb 2 oz (95.312 kg)  BMI 31.02 kg/m2  SpO2 98% Physical Exam  Constitutional: He is oriented to person, place, and time. He appears well-developed and well-nourished. No distress.  HENT:  Head: Normocephalic and atraumatic.  Right Ear: External ear normal.  Left Ear: External ear normal.  Nose: Nose normal.  Mouth/Throat: Oropharynx is clear and moist. No oropharyngeal exudate.  Eyes: Conjunctivae and EOM are normal. Pupils are equal, round, and reactive to light. Right eye exhibits no discharge. Left eye exhibits no discharge. No scleral icterus.  Neck: Normal range of motion. Neck supple. No tracheal deviation present. No thyromegaly present.  Cardiovascular: Normal rate, regular rhythm and normal heart sounds.  Exam reveals no gallop and no friction rub.   No murmur heard. Pulmonary/Chest: Effort normal and breath sounds normal. No accessory muscle usage. No tachypnea. No respiratory distress. He has no decreased breath sounds. He has no wheezes. He has no rhonchi. He has no rales. He exhibits no tenderness.  Musculoskeletal: Normal range of motion. He exhibits no edema.  Lymphadenopathy:    He has no cervical adenopathy.  Neurological: He is alert and oriented to person, place, and time. No cranial nerve deficit. Coordination normal.  Skin: Skin is warm and dry. No rash noted. He is not diaphoretic. No erythema. No pallor.  Psychiatric: He has a normal mood and affect. His behavior is normal. Judgment and thought content normal.          Assessment & Plan:   Problem List Items Addressed This Visit      Unprioritized   Arthralgia -  Primary    Recurrent arthralgia. Exam is normal. Previous lab evaluation including markers of inflammation, ANA, RF all normal. Will get plain xrays of left elbow and right ankle. Will set up rheumatology evaluation. Continue Meloxicam. Start prn Tramadol.      Relevant Medications   traMADol (ULTRAM) 50 MG tablet   Other Relevant Orders   Ambulatory referral to Rheumatology   DG Elbow Complete Left   DG Ankle Complete Right       Return in about 4 weeks (around 08/02/2015) for Recheck.  Ronette Deter, MD Internal Medicine [facility]

## 2015-07-05 NOTE — Progress Notes (Signed)
Pre visit review using our clinic review tool, if applicable. No additional management support is needed unless otherwise documented below in the visit note. 

## 2015-07-05 NOTE — Patient Instructions (Signed)
Start Tramadol 50mg  up to three times daily for pain.  We will set up evaluation with rheumatology.

## 2015-07-05 NOTE — Assessment & Plan Note (Signed)
Recurrent arthralgia. Exam is normal. Previous lab evaluation including markers of inflammation, ANA, RF all normal. Will get plain xrays of left elbow and right ankle. Will set up rheumatology evaluation. Continue Meloxicam. Start prn Tramadol.

## 2015-07-18 ENCOUNTER — Telehealth: Payer: Self-pay

## 2015-07-18 NOTE — Telephone Encounter (Signed)
Pt came into the office for copy of most recent labs

## 2015-08-15 ENCOUNTER — Telehealth: Payer: Self-pay | Admitting: Internal Medicine

## 2015-08-15 ENCOUNTER — Ambulatory Visit: Payer: BC Managed Care – PPO | Admitting: Family Medicine

## 2015-08-15 NOTE — Telephone Encounter (Signed)
Patient Name: Anthony Stewart  DOB: 1975/08/05    Initial Comment Caller states her husbands eyes are burning, hazy vision. They were welding at the job site.   Nurse Assessment  Nurse: Raphael Gibney, RN, Vanita Ingles Date/Time (Eastern Time): 08/15/2015 8:35:48 AM  Confirm and document reason for call. If symptomatic, describe symptoms. You must click the next button to save text entered. ---Caller states spouse has been welding. he is having hazy vision and his eyes are burning. states that her spouse just told her that he is going to see the company doctor and needs his appt that is scheduled with Dr. Lacinda Axon cancelled for today.  Has the patient traveled out of the country within the last 30 days? ---Not Applicable  Does the patient have any new or worsening symptoms? ---Yes  Will a triage be completed? ---No  Select reason for no triage. ---Other  Please document clinical information provided and list any resource used. ---Spouse states he is going to see the company doctor. Appt cancelled with Dr. Lacinda Axon today and advised to call back for further needs. Verbalized understanding.     Guidelines    Guideline Title Affirmed Question Affirmed Notes       Final Disposition User   Clinical Call [place], RN, Vanita Ingles

## 2015-09-24 ENCOUNTER — Other Ambulatory Visit: Payer: Self-pay

## 2015-09-24 MED ORDER — MELOXICAM 15 MG PO TABS: ORAL_TABLET | ORAL | Status: DC

## 2015-09-24 NOTE — Telephone Encounter (Signed)
Pt requesting refill on Meloxicam. Pt was advised to follow up in four weeks back in March if not better. Okay to refill? Medication pended.

## 2015-11-13 ENCOUNTER — Ambulatory Visit: Payer: BC Managed Care – PPO | Admitting: Internal Medicine

## 2015-11-13 DIAGNOSIS — Z0289 Encounter for other administrative examinations: Secondary | ICD-10-CM

## 2015-11-19 ENCOUNTER — Telehealth: Payer: Self-pay | Admitting: *Deleted

## 2015-11-19 NOTE — Telephone Encounter (Signed)
[facility] has requested to have another provider other than Dr. Gilford Rile  call in the Rx refill for Tramadol

## 2015-11-19 NOTE — Telephone Encounter (Signed)
Will need to be seen

## 2015-11-19 NOTE — Telephone Encounter (Signed)
Last refilled in March, please advise he has no follow up scheduled, thanks

## 2015-11-20 NOTE — Telephone Encounter (Signed)
Please schedule patient for a follow up appt with a provider for refills, thanks

## 2015-11-20 NOTE — Telephone Encounter (Signed)
Pt wife called to follow up on the refill and to why pt cannot get his medication refilled. I advised pt that due to Dr Gilford Rile leaving pt has to have a follow up for the new provider to refill Rx. I explained to wife as to why he needs to have a follow up appt. Wife stated she will go somewhere else and hung up.

## 2015-11-20 NOTE — Telephone Encounter (Signed)
Noted, thanks  FYI sent to [place]. thanks

## 2015-11-20 NOTE — Telephone Encounter (Signed)
LVM to call office.

## 2016-05-28 ENCOUNTER — Ambulatory Visit: Payer: BC Managed Care – PPO | Admitting: Family Medicine

## 2016-05-29 ENCOUNTER — Encounter: Payer: Self-pay | Admitting: Family Medicine

## 2016-05-29 ENCOUNTER — Ambulatory Visit (INDEPENDENT_AMBULATORY_CARE_PROVIDER_SITE_OTHER): Payer: BC Managed Care – PPO | Admitting: Family Medicine

## 2016-05-29 DIAGNOSIS — I10 Essential (primary) hypertension: Secondary | ICD-10-CM

## 2016-05-29 DIAGNOSIS — G8929 Other chronic pain: Secondary | ICD-10-CM | POA: Diagnosis not present

## 2016-05-29 DIAGNOSIS — M25512 Pain in left shoulder: Secondary | ICD-10-CM

## 2016-05-29 DIAGNOSIS — M25511 Pain in right shoulder: Secondary | ICD-10-CM

## 2016-05-29 MED ORDER — HYDROCODONE-ACETAMINOPHEN 5-325 MG PO TABS: 1.0000 | ORAL_TABLET | Freq: Four times a day (QID) | ORAL | 0 refills | Status: DC | PRN

## 2016-05-29 MED ORDER — AMLODIPINE BESYLATE 10 MG PO TABS: 10.0000 mg | ORAL_TABLET | Freq: Every day | ORAL | 1 refills | Status: DC

## 2016-05-29 MED ORDER — DICLOFENAC SODIUM 75 MG PO TBEC: 75.0000 mg | DELAYED_RELEASE_TABLET | Freq: Two times a day (BID) | ORAL | 0 refills | Status: DC

## 2016-05-29 NOTE — Assessment & Plan Note (Signed)
Established problem, worsening. Right greater than left currently. Patient is not having underlying fracture given his recent fall. Seems to be just an acute exacerbation of his underlying chronic shoulder pain. No one has seemed to be able to improve his problem/quality of life. Given acute exacerbation, treating pain with Vicodin. Switching over to diclofenac. Sending to orthopedics. He's needs MRI.

## 2016-05-29 NOTE — Assessment & Plan Note (Signed)
Stab problem, worsening. Increasing Norvasc to 10 mg daily.

## 2016-05-29 NOTE — Progress Notes (Signed)
Subjective:  Patient ID: Anthony Stewart, male    DOB: 03-31-1976  Age: 41 y.o. MRN: [id number]  CC: Shoulder pain  HPI:  41 year old male with a long-standing history of bilateral shoulder pain presents with complaints of worsening shoulder pain, particularly the right.  Patient has had chronic bilateral shoulder pain. He has been seen by sports medicine on 2 occasions. 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 He was placed on allopurinol. He states that the allopurinol actually worsened his shoulder pain. He states that he also got placed on a course of doxycycline which improved other arthralgias but has not resolved his shoulder pain. He has had injection previously without significant improvement.  Patient reports that he suffered a fall on Tuesday and landed on his right shoulder. Since that time he's had worsening right shoulder pain. Moderate to severe. Worse with certain ranges of motion. He's been taking meloxicam and tramadol without improvement. He would like to discuss further evaluation and treatment today.  Additionally, patient's blood pressure is elevated today. He is compliant with amlodipine. He states that he feels like he is not working. He states that his pressure is always elevated when he is in a healthcare setting.  Social Hx   Social History   Social History  . Marital status: Married    Spouse name: N/A  . Number of children: N/A  . Years of education: N/A   Social History Main Topics  . Smoking status: Former Research scientist (life sciences)  . Smokeless tobacco: Never Used  . Alcohol use Yes     Comment: 4-6 beers per night.  . Drug use: No  . Sexual activity: Not Asked   Other Topics Concern  . None   Social History Narrative   Lives in [place] with wife.      Work - Development worker, community         Review of Systems  Constitutional: Negative.   Musculoskeletal:       Shoulder pain.   Objective:  BP (!) 148/87   Pulse 92   Temp 98  F (36.7 C) (Oral)   Wt 209 lb (94.8 kg)   SpO2 97%   BMI 30.86 kg/m   BP/Weight 05/29/2016 07/05/2015 0000000  Systolic BP 123456 [id number] 0000000  Diastolic BP 87 80 81  Wt. (Lbs) 209 210.13 209.38  BMI 30.86 31.02 30.91   Physical Exam  Constitutional: He is oriented to person, place, and time. He appears well-developed. No distress.  Pulmonary/Chest: Effort normal.  Musculoskeletal:  Shoulder: Right Inspection reveals no abnormalities, atrophy or asymmetry. Palpation is normal with no tenderness over AC joint or bicipital groove. ROM decreased in Abduction and flexion. Rotator cuff strength 4/5 infraspinatus/teres minor.  + Impingement ([place], empty can).  Neurological: He is alert and oriented to person, place, and time.  Psychiatric: He has a normal mood and affect.  Vitals reviewed.  Lab Results  Component Value Date   WBC 6.8 02/26/2015   HGB 15.8 02/26/2015   HCT 46.8 02/26/2015   PLT 324.0 02/26/2015   GLUCOSE 108 (H) 05/16/2015   CHOL 251 (H) 02/26/2015   TRIG 300.0 (H) 02/26/2015   HDL 51.70 02/26/2015   LDLDIRECT 160.0 02/26/2015   ALT 41 05/16/2015   AST 33 05/16/2015   NA 140 05/16/2015   K 3.9 05/16/2015   CL 103 05/16/2015   CREATININE 0.94 05/16/2015   BUN 13 05/16/2015   CO2 29 05/16/2015   TSH 1.74 02/26/2015  Assessment & Plan:   Problem List Items Addressed This Visit    Essential hypertension    Stab problem, worsening. Increasing Norvasc to 10 mg daily.      Relevant Medications   amLODipine (NORVASC) 10 MG tablet   Bilateral shoulder pain    Established problem, worsening. Right greater than left currently. Patient is not having underlying fracture given his recent fall. Seems to be just an acute exacerbation of his underlying chronic shoulder pain. No one has seemed to be able to improve his problem/quality of life. Given acute exacerbation, treating pain with Vicodin. Switching over to diclofenac. Sending to orthopedics. He's needs  MRI.      Relevant Orders   Ambulatory referral to Orthopedic Surgery      Meds ordered this encounter  Medications  . diclofenac (VOLTAREN) 75 MG EC tablet    Sig: Take 1 tablet (75 mg total) by mouth 2 (two) times daily.    Dispense:  60 tablet    Refill:  0  . HYDROcodone-acetaminophen (NORCO/VICODIN) 5-325 MG tablet    Sig: Take 1 tablet by mouth every 6 (six) hours as needed for moderate pain.    Dispense:  20 tablet    Refill:  0  . amLODipine (NORVASC) 10 MG tablet    Sig: Take 1 tablet (10 mg total) by mouth daily.    Dispense:  90 tablet    Refill:  1    Follow-up: Return in about 1 month (around 06/26/2016).  [place]

## 2016-05-29 NOTE — Patient Instructions (Signed)
We will call with the referral.  Use the pain medication as prescribed.  Stop mobic. Start Diclofenac.  Take care  Dr. Lacinda Axon

## 2016-05-29 NOTE — Progress Notes (Signed)
Pre visit review using our clinic review tool, if applicable. No additional management support is needed unless otherwise documented below in the visit note. 

## 2016-05-29 NOTE — Assessment & Plan Note (Signed)
>>  ASSESSMENT AND PLAN FOR BILATERAL SHOULDER PAIN WRITTEN ON 05/29/2016 12:12 PM BY COOK, JAYCE G, DO  Established problem, worsening. Right greater than left currently. Patient is not having underlying fracture given his recent fall. Seems to be just an acute exacerbation of his underlying chronic shoulder pain. No one has seemed to be able to improve his problem/quality of life. Given acute exacerbation, treating pain with Vicodin. Switching over to diclofenac. Sending to orthopedics. He's needs MRI.

## 2016-06-22 ENCOUNTER — Other Ambulatory Visit: Payer: Self-pay | Admitting: Internal Medicine

## 2016-06-22 DIAGNOSIS — G8929 Other chronic pain: Secondary | ICD-10-CM

## 2016-06-22 DIAGNOSIS — M25511 Pain in right shoulder: Principal | ICD-10-CM

## 2016-07-02 ENCOUNTER — Ambulatory Visit
Admission: RE | Admit: 2016-07-02 | Discharge: 2016-07-02 | Disposition: A | Discharge status: Home / Self Care | Payer: BC Managed Care – PPO | Source: Ambulatory Visit | Attending: Internal Medicine | Admitting: Internal Medicine

## 2016-07-02 DIAGNOSIS — M75121 Complete rotator cuff tear or rupture of right shoulder, not specified as traumatic: Secondary | ICD-10-CM | POA: Insufficient documentation

## 2016-07-02 DIAGNOSIS — M7551 Bursitis of right shoulder: Secondary | ICD-10-CM | POA: Diagnosis not present

## 2016-07-02 DIAGNOSIS — G8929 Other chronic pain: Secondary | ICD-10-CM

## 2016-07-02 DIAGNOSIS — M25511 Pain in right shoulder: Secondary | ICD-10-CM | POA: Insufficient documentation

## 2016-07-02 DIAGNOSIS — M25512 Pain in left shoulder: Secondary | ICD-10-CM | POA: Insufficient documentation

## 2016-07-02 DIAGNOSIS — M19011 Primary osteoarthritis, right shoulder: Secondary | ICD-10-CM | POA: Insufficient documentation

## 2016-07-16 DIAGNOSIS — M7551 Bursitis of right shoulder: Secondary | ICD-10-CM | POA: Insufficient documentation

## 2016-07-16 DIAGNOSIS — M67911 Unspecified disorder of synovium and tendon, right shoulder: Secondary | ICD-10-CM | POA: Insufficient documentation

## 2016-07-16 DIAGNOSIS — M67921 Unspecified disorder of synovium and tendon, right upper arm: Secondary | ICD-10-CM | POA: Insufficient documentation

## 2016-08-05 ENCOUNTER — Other Ambulatory Visit: Payer: Self-pay | Admitting: Family Medicine

## 2017-03-07 IMAGING — CR DG ANKLE COMPLETE 3+V*R*
1 series · 3 of 3 positions shown · non-contrast
Comparison: None.

CLINICAL DATA: Right ankle pain.  No reported injury.

EXAM:
RIGHT ANKLE - COMPLETE 3+ VIEW

[Series 1: dg ankle complete right · 0.14mm/px · 3 of 3 slices shown]
[im 1/3]
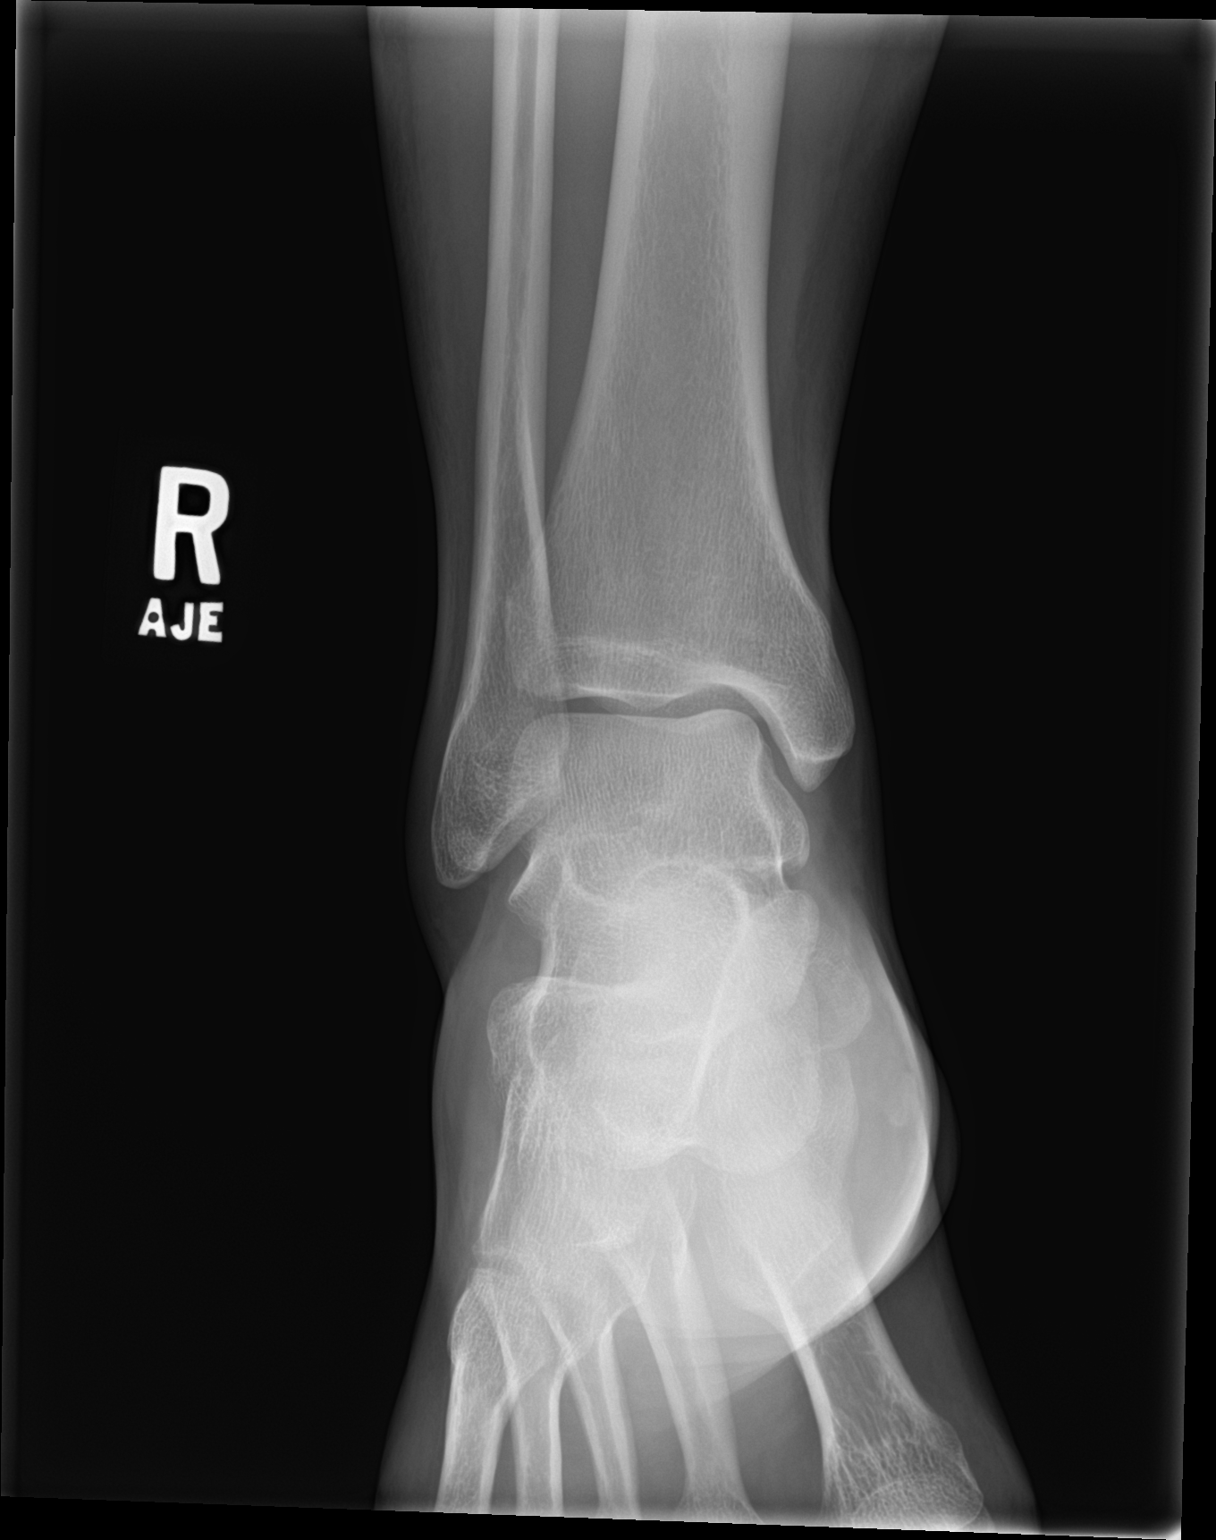
[im 2/3]
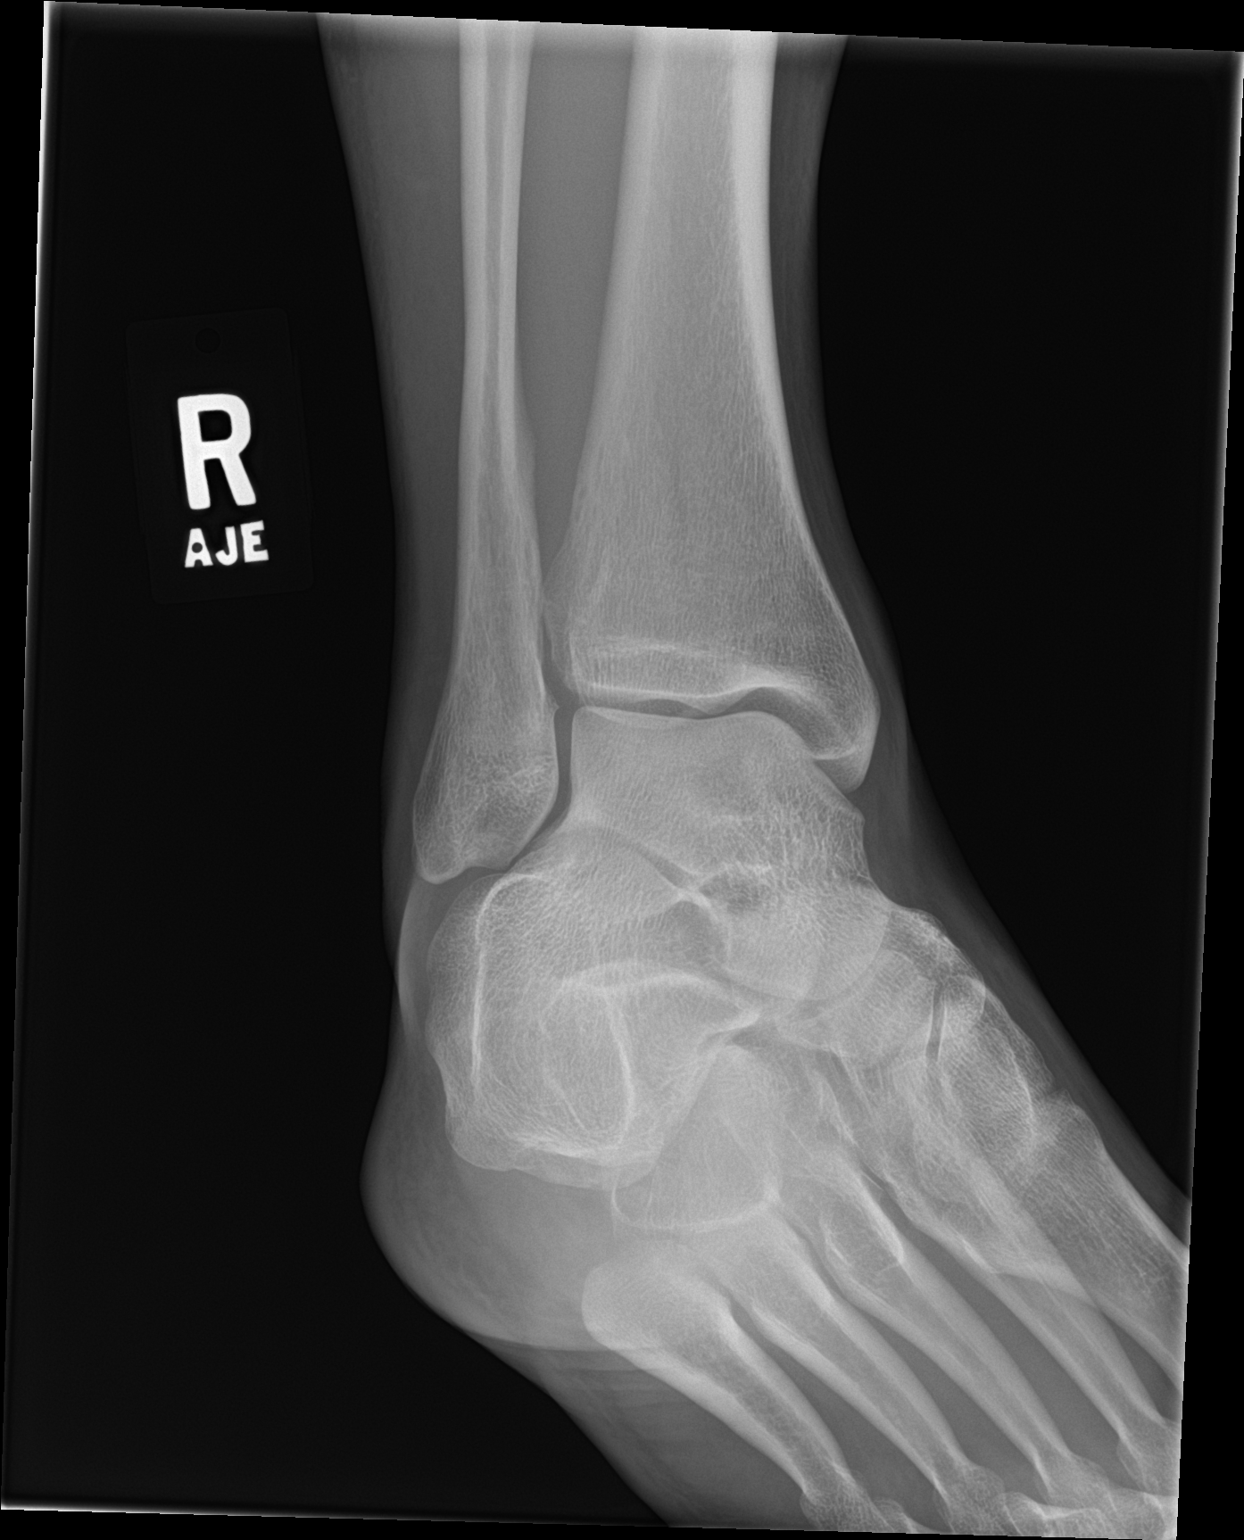
[im 3/3]
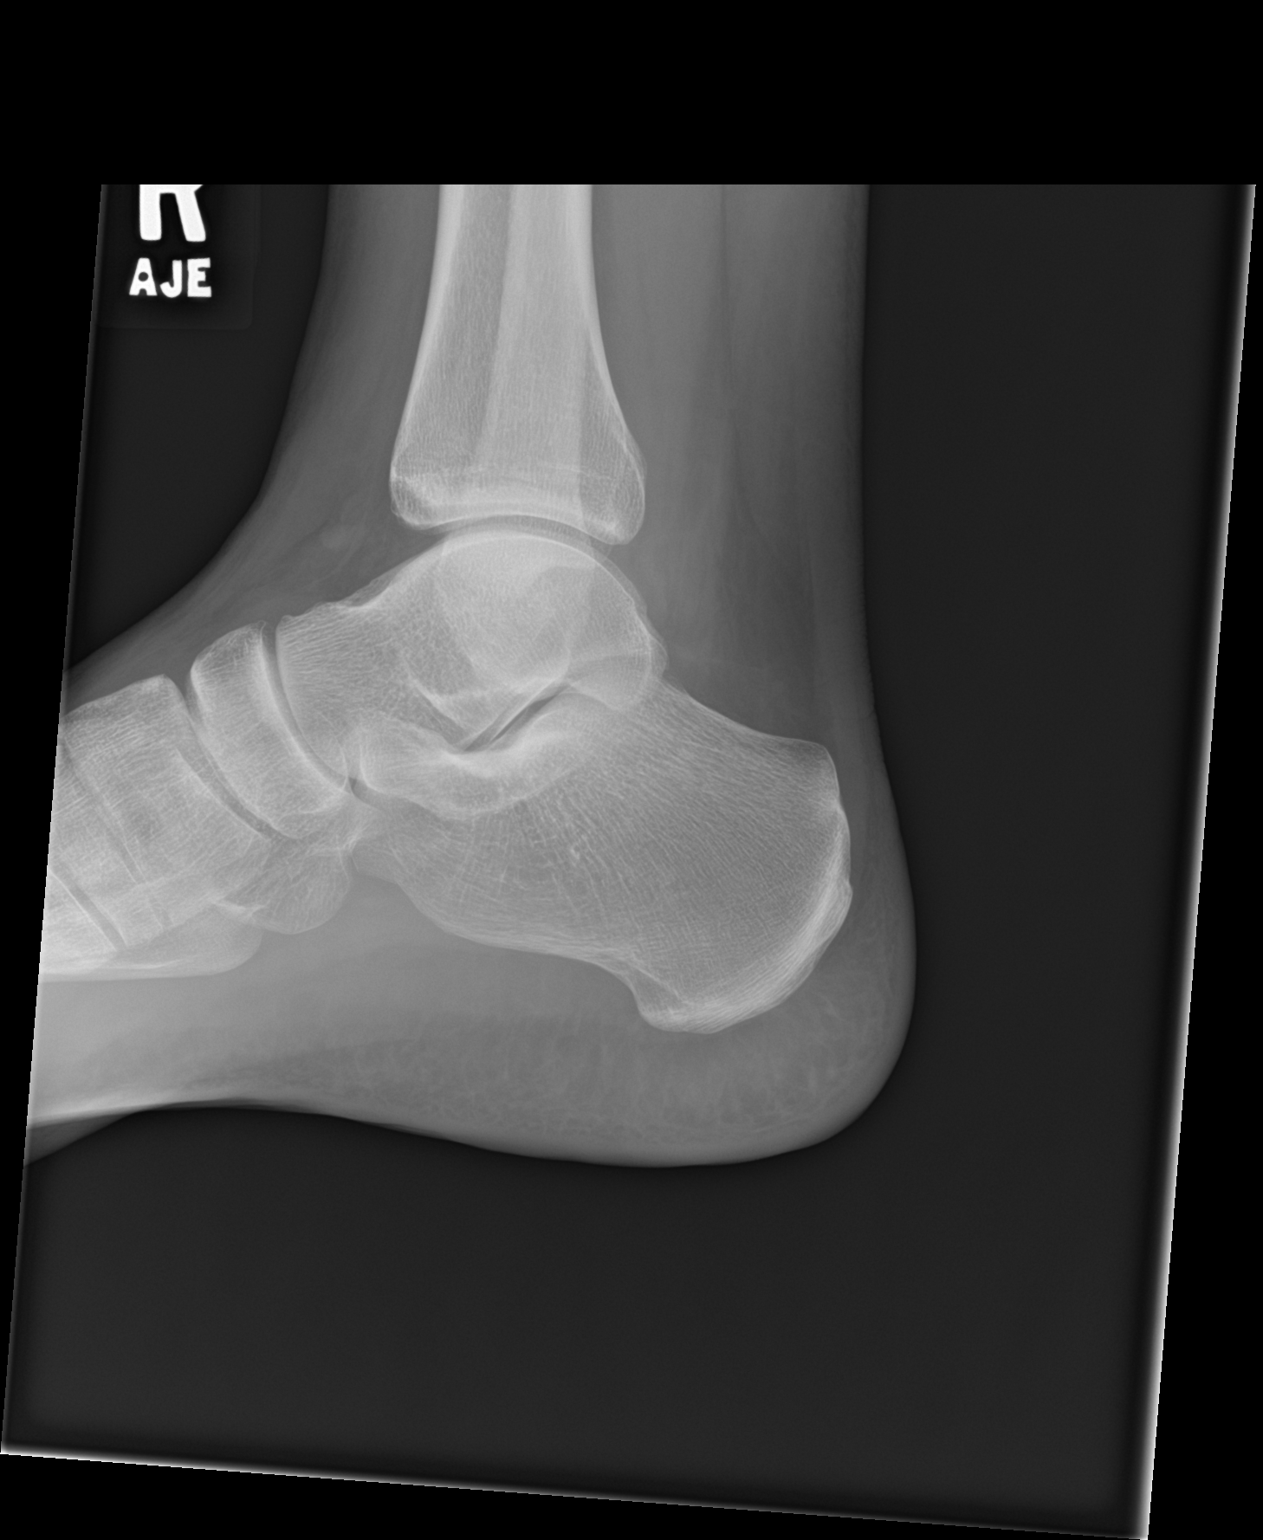

[3 of 3 positions shown; findings below may reference images not displayed]

FINDINGS: There is no evidence of fracture, dislocation, or joint effusion.
There is no evidence of arthropathy or other focal bone abnormality.
Soft tissues are unremarkable.
IMPRESSION: Negative.

## 2017-03-07 IMAGING — CR DG ELBOW COMPLETE 3+V*L*
1 series · 4 of 4 positions shown · non-contrast
Comparison: None.

CLINICAL DATA: Chronic pain.  No recent trauma

EXAM:
LEFT ELBOW - COMPLETE 3+ VIEW

[Series 1: dg elbow complete left · 0.14mm/px · 4 of 4 slices shown]
[im 1/4]
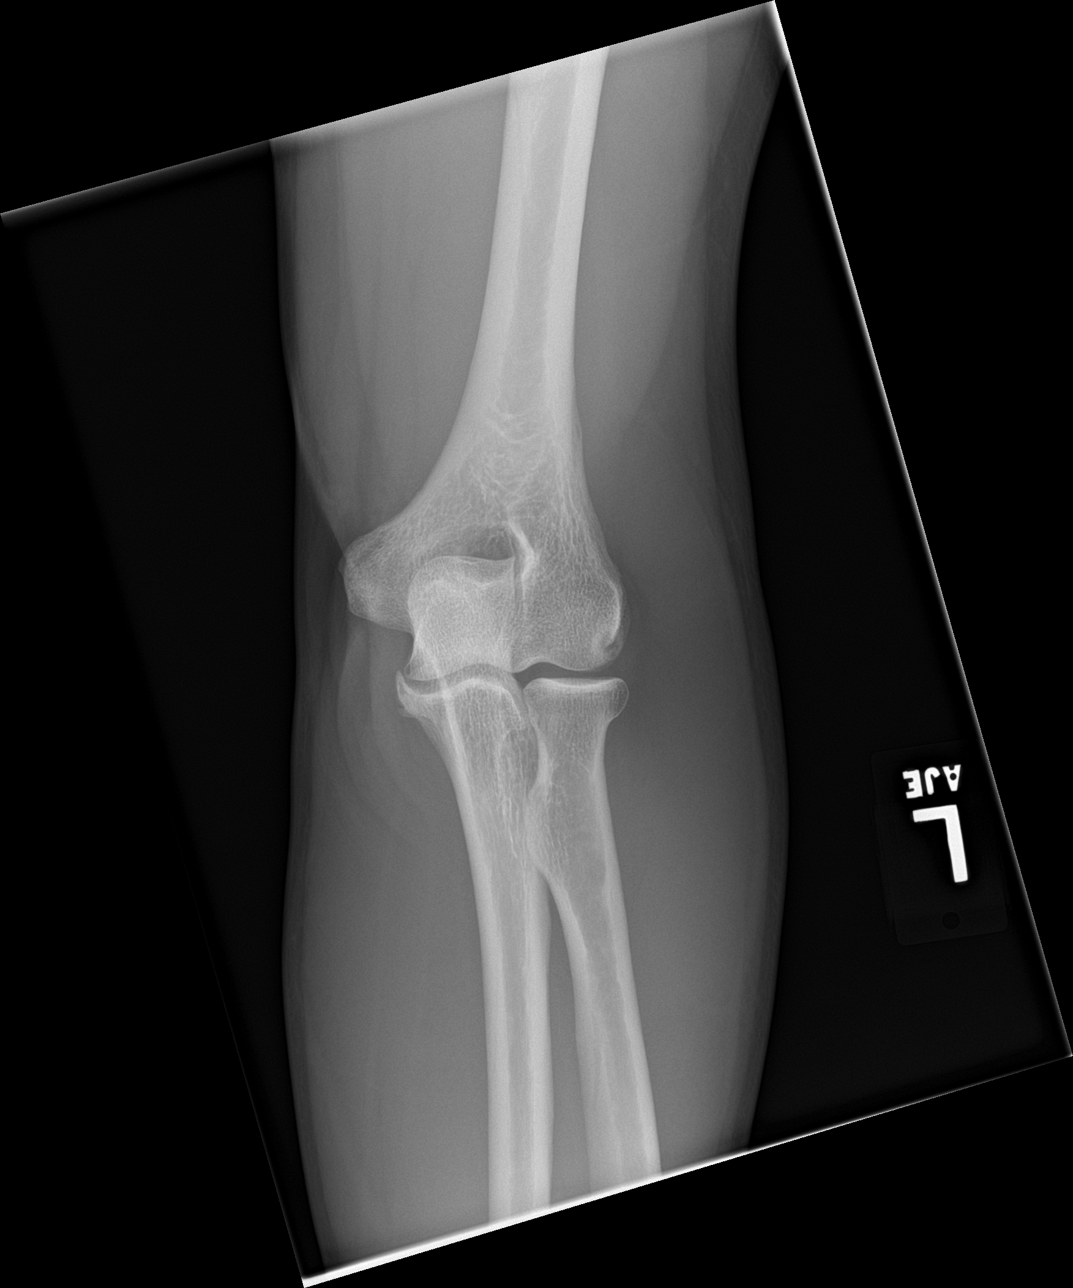
[im 2/4]
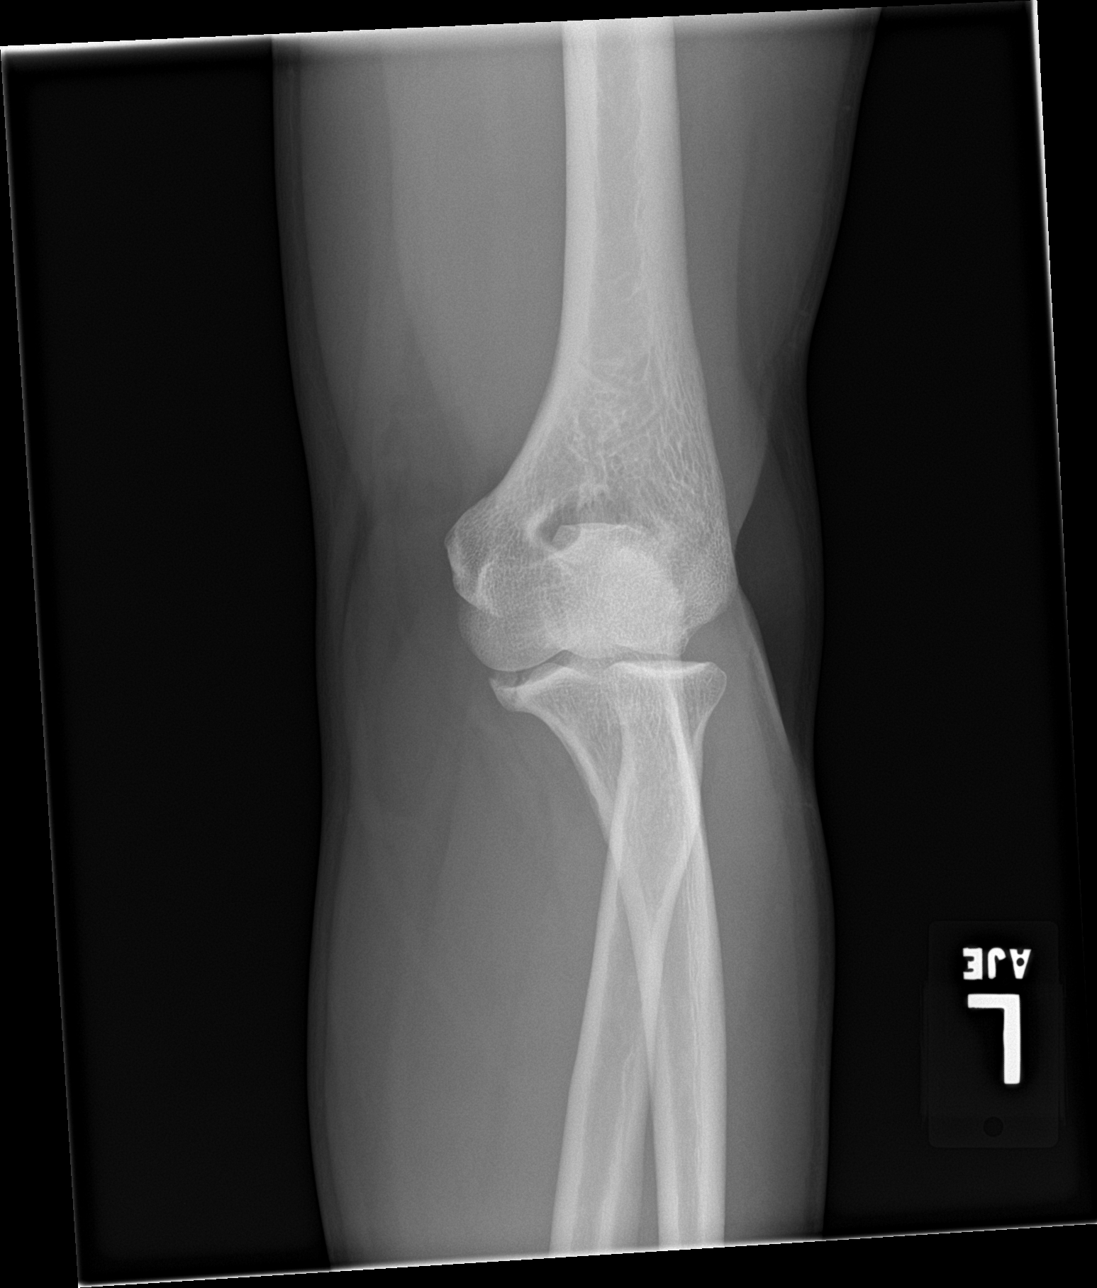
[im 3/4]
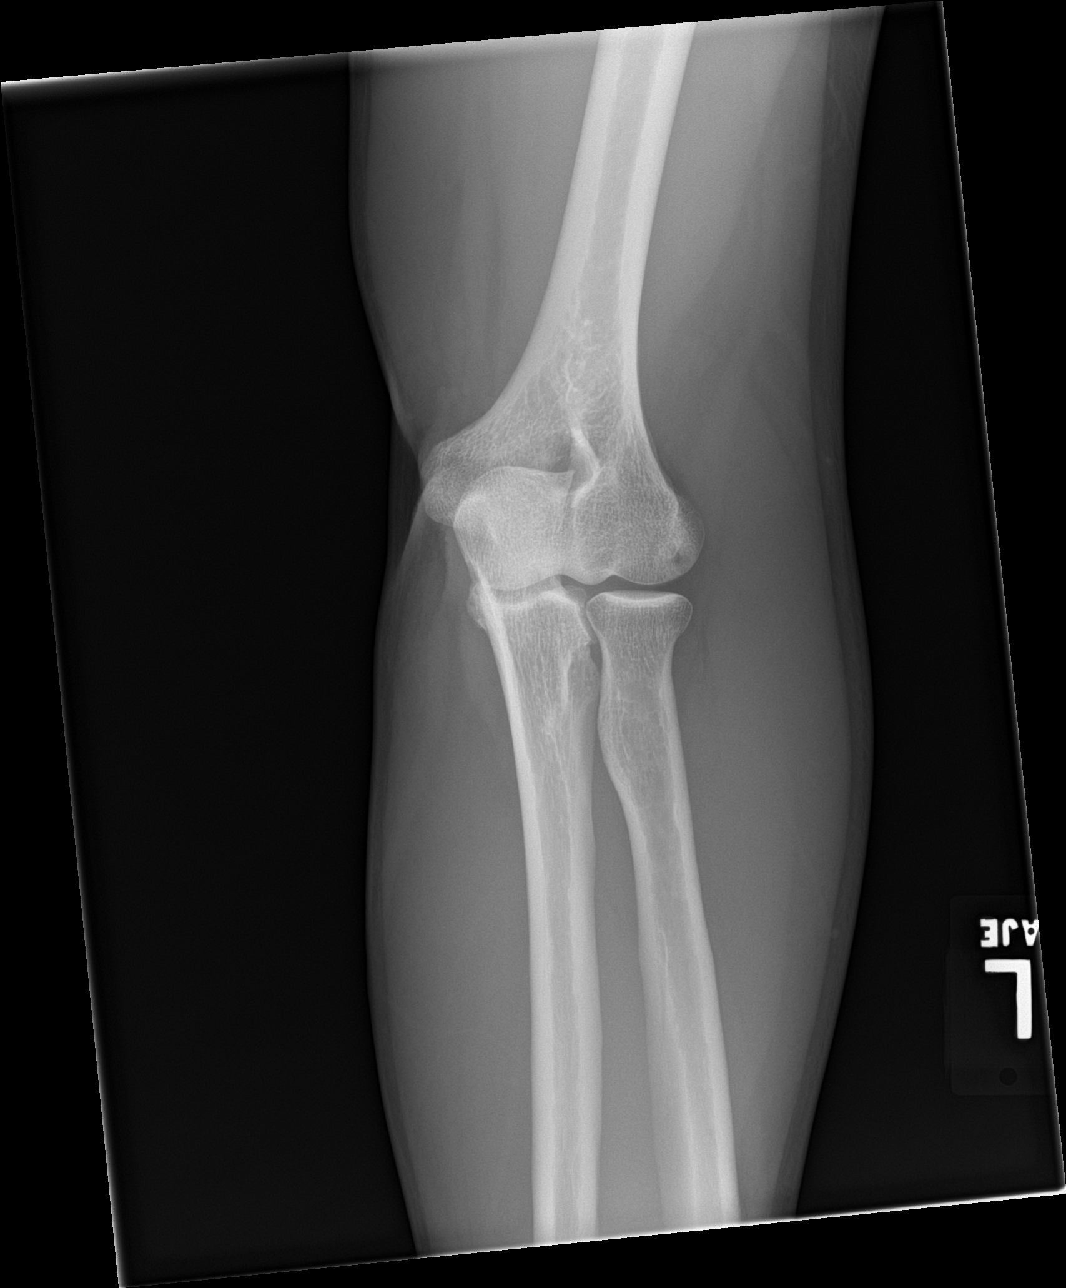
[im 4/4]
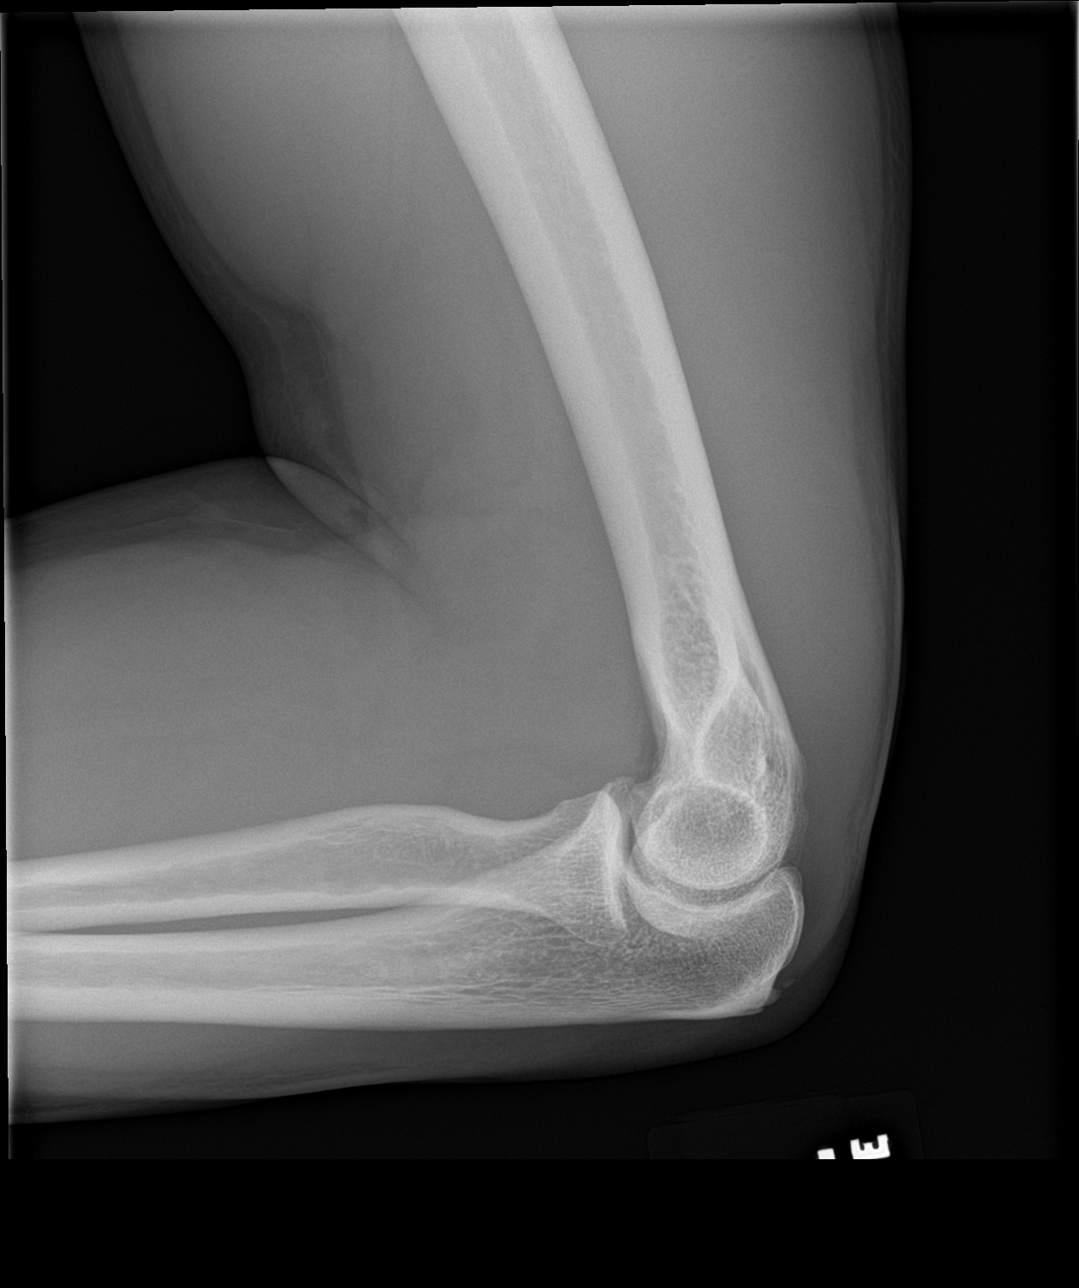

[4 of 4 positions shown; findings below may reference images not displayed]

FINDINGS: Frontal, lateral, and bilateral oblique views were obtained. There
is no demonstrable acute fracture or dislocation. There is evidence
of a healing fracture along the coracoid process of the proximal
ulna. No joint effusion. There is a small spur arising from the
olecranon process of the proximal ulna. No appreciable joint space
narrowing. No erosive change.
IMPRESSION: Healing nondisplaced fracture coracoid process proximal ulna. No
joint effusion. Mild spurring arising from the coracoid and
olecranon processes of the proximal ulna. No appreciable joint space
narrowing.

## 2017-04-18 ENCOUNTER — Encounter: Payer: Self-pay | Admitting: *Deleted

## 2017-04-18 ENCOUNTER — Other Ambulatory Visit: Payer: Self-pay

## 2017-04-18 ENCOUNTER — Ambulatory Visit
Admission: EM | Admit: 2017-04-18 | Discharge: 2017-04-18 | Disposition: A | Discharge status: Home / Self Care | Payer: BC Managed Care – PPO | Attending: Family Medicine | Admitting: Family Medicine

## 2017-04-18 DIAGNOSIS — R0981 Nasal congestion: Secondary | ICD-10-CM

## 2017-04-18 DIAGNOSIS — R05 Cough: Secondary | ICD-10-CM

## 2017-04-18 DIAGNOSIS — R69 Illness, unspecified: Secondary | ICD-10-CM | POA: Diagnosis not present

## 2017-04-18 DIAGNOSIS — J111 Influenza due to unidentified influenza virus with other respiratory manifestations: Secondary | ICD-10-CM

## 2017-04-18 MED ORDER — OSELTAMIVIR PHOSPHATE 75 MG PO CAPS: 75.0000 mg | ORAL_CAPSULE | Freq: Two times a day (BID) | ORAL | 0 refills | Status: DC

## 2017-04-18 MED ORDER — HYDROCOD POLST-CPM POLST ER 10-8 MG/5ML PO SUER: 5.0000 mL | Freq: Every evening | ORAL | 0 refills | Status: DC | PRN

## 2017-04-18 NOTE — Discharge Instructions (Signed)
Take medication as prescribed. Rest. Drink plenty of fluids.  ° °Follow up with your primary care physician this week as needed. Return to Urgent care for new or worsening concerns.  ° °

## 2017-04-18 NOTE — ED Provider Notes (Signed)
[facility] ____________________________________________  Time seen: Approximately 12:42 PM  I have reviewed the triage vital signs and the nursing notes.   HISTORY  Chief Complaint Cough and Nasal Congestion   HPI Anthony Stewart is a 41 y.o. male present was positive bedside for evaluation of body aches, chills, subjective fever, some congestion and cough.  Patient reports yesterday afternoon into last night he was having diffuse body aches and felt really hot to touch.  Reports that he did not measure his temperature.  Reports did take some ibuprofen today prior to arrival.  Has not taking any other over-the-counter medications for the same complaints.  Reports some sick contacts at work, denies home sick contacts with similar. States nose feels dry and irritated. Reports overall continues to eat and drink well.  Denies sore throat, chest pain, shortness of breath, abdominal pain, rash, pain moving neck or back, dysuria, or other complaints. Denies recent sickness. Denies recent antibiotic use.  Reports that he does have a history of high blood pressure but does not take his medications at this time, encourage patient and wife to correct this.   Past Medical History:  Diagnosis Date  . Hypertension     Patient Active Problem List   Diagnosis Date Noted  . Essential hypertension 02/26/2015  . Hyperlipidemia 02/26/2015  . Bilateral shoulder pain 02/26/2015  . Alcohol use 02/26/2015    History reviewed. No pertinent surgical history.   No current facility-administered medications for this encounter.   Current Outpatient Medications:  .  chlorpheniramine-HYDROcodone (TUSSIONEX PENNKINETIC ER) 10-8 MG/5ML SUER, Take 5 mLs by mouth at bedtime as needed. do not drive or operate machinery while taking as can cause drowsiness., Disp: 75 mL, Rfl: 0 .  oseltamivir (TAMIFLU) 75 MG capsule, Take 1 capsule (75 mg total) by mouth every 12 (twelve) hours., Disp: 10 capsule, Rfl:  0  Allergies Patient has no known allergies.  History reviewed. No pertinent family history.  Social History Social History   Tobacco Use  . Smoking status: Former Research scientist (life sciences)  . Smokeless tobacco: Never Used  Substance Use Topics  . Alcohol use: Yes    Comment: 4-6 beers per night.  . Drug use: No    Review of Systems Constitutional: As above.  Eyes: No visual changes. ENT: No sore throat. Cardiovascular: Denies chest pain. Respiratory: Denies shortness of breath. Gastrointestinal: No abdominal pain. Musculoskeletal: Negative for back pain. Skin: Negative for rash.  ____________________________________________   PHYSICAL EXAM:  VITAL SIGNS: ED Triage Vitals  Enc Vitals Group     BP 04/18/17 1203 (!) 149/98     Pulse Rate 04/18/17 1203 97     Resp 04/18/17 1203 16     Temp 04/18/17 1203 98.7 F (37.1 C)     Temp Source 04/18/17 1203 Oral     SpO2 04/18/17 1203 97 %     Weight 04/18/17 1204 200 lb (90.7 kg)     Height 04/18/17 1204 5\' 10"  (1.778 m)     Head Circumference --      Peak Flow --      Pain Score 04/18/17 1205 0     Pain Loc --      Pain Edu? --      Excl. in [place]? --     Constitutional: Alert and oriented. Well appearing and in no acute distress. Eyes: Conjunctivae are normal.  Head: Atraumatic. No sinus tenderness to palpation. No swelling. No erythema.  Ears: no erythema, normal TMs bilaterally.   Nose:No  nasal congestion.   Mouth/Throat: Mucous membranes are moist. No pharyngeal erythema. No tonsillar swelling or exudate.  Neck: No stridor.  No cervical spine tenderness to palpation. Hematological/Lymphatic/Immunilogical: No cervical lymphadenopathy. Cardiovascular: Normal rate, regular rhythm. Grossly normal heart sounds. Good peripheral circulation. Respiratory: Normal respiratory effort.  No retractions. No wheezes, rales or rhonchi. Good air movement.  Musculoskeletal: Ambulatory with steady gait. No cervical, thoracic or lumbar tenderness to  palpation. Neurologic:  Normal speech and language. No gait instability. No meningismus. Skin:  Skin appears warm, dry and intact. No rash noted. Psychiatric: Mood and affect are normal. Speech and behavior are normal.  ___________________________________________   LABS (all labs ordered are listed, but only abnormal results are displayed)  Labs Reviewed - No data to display  PROCEDURES Procedures    INITIAL IMPRESSION / ASSESSMENT AND PLAN / ED COURSE  Pertinent labs & imaging results that were available during my care of the patient were reviewed by me and considered in my medical decision making (see chart for details).  Well-appearing patient.  No acute distress.  Suspect influenza.  Discussed treatment options.  Will treat patient with oral Tamiflu, as needed Tussionex.  Encourage rest, fluids, supportive care.Discussed indication, risks and benefits of medications with patient including no alcohol or driving with cough syrup.  Discussed follow up with Primary care physician this week. Discussed follow up and return parameters including no resolution or any worsening concerns. Patient verbalized understanding and agreed to plan.   ____________________________________________   FINAL CLINICAL IMPRESSION(S) / ED DIAGNOSES  Final diagnoses:  Influenza-like illness     ED Discharge Orders        Ordered    oseltamivir (TAMIFLU) 75 MG capsule  Every 12 hours     04/18/17 1252    chlorpheniramine-HYDROcodone (TUSSIONEX PENNKINETIC ER) 10-8 MG/5ML SUER  At bedtime PRN     04/18/17 1252       Note: This dictation was prepared with Dragon dictation along with smaller phrase technology. Any transcriptional errors that result from this process are unintentional.         Marylene Land, NP 04/18/17 1316

## 2017-04-18 NOTE — ED Triage Notes (Signed)
Patient started having symptoms of cough, chest congestion, fever, and body aches last PM.

## 2017-10-01 DIAGNOSIS — M75121 Complete rotator cuff tear or rupture of right shoulder, not specified as traumatic: Secondary | ICD-10-CM | POA: Insufficient documentation

## 2018-03-05 IMAGING — MR MR SHOULDER*R* W/O CM
5 series · 40 of 40 positions shown · non-contrast
Comparison: None.

CLINICAL DATA: Chronic right shoulder pain. Status post fall 1
month ago.

EXAM:
MRI OF THE RIGHT SHOULDER WITHOUT CONTRAST
TECHNIQUE: Multiplanar, multisequence MR imaging of the shoulder was performed.
No intravenous contrast was administered.

[Series 3: T2 fat-sat · axial · 4.0mm · 0.59mm/px · z∈[-32,+60]mm · 9 of 22 slices shown (1 of 2)]
[im 1/22]
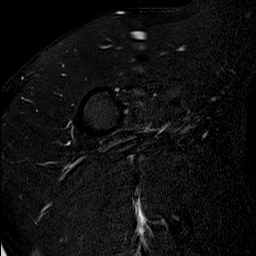
[im 3/22]
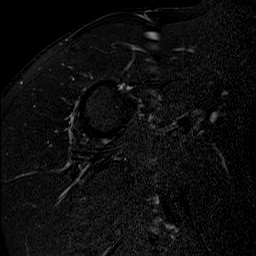
[im 6/22]
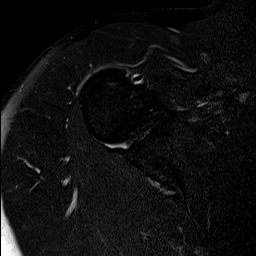
[im 8/22]
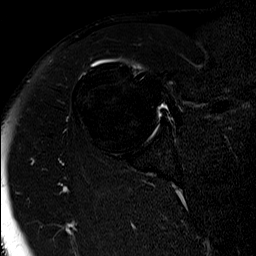
[im 11/22]
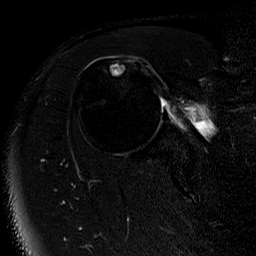
[im 14/22]
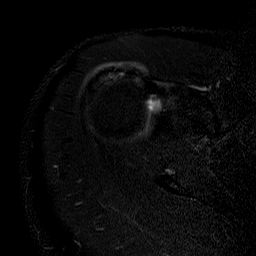
[im 16/22]
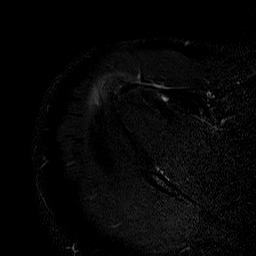
[im 19/22]
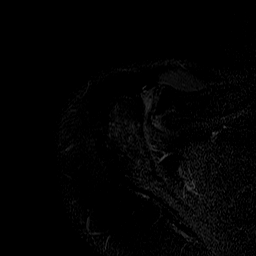
[im 22/22]
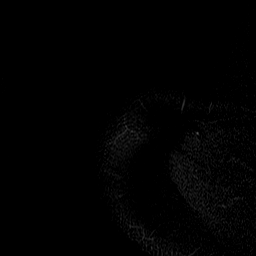

[Series 4: T2 fat-sat · oblique · 4.0mm · 0.59mm/px · 8 of 19 slices shown (2 of 2)]
[im 1/19]
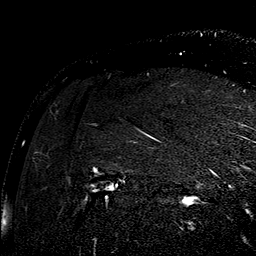
[im 3/19]
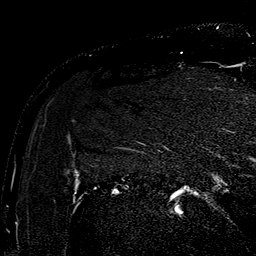
[im 6/19]
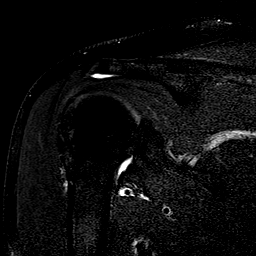
[im 8/19]
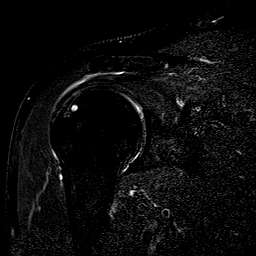
[im 11/19]
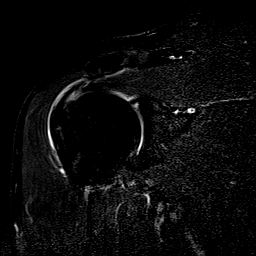
[im 13/19]
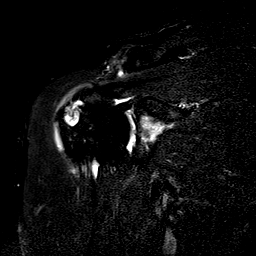
[im 16/19]
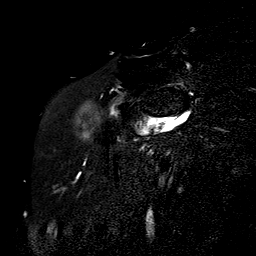
[im 19/19]
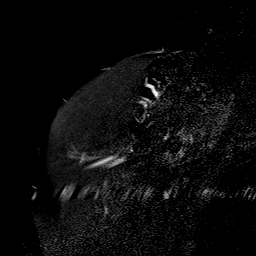

[Series 5: PD · oblique · 4.0mm · 0.59mm/px · 7 of 19 slices shown]
[im 1/19]
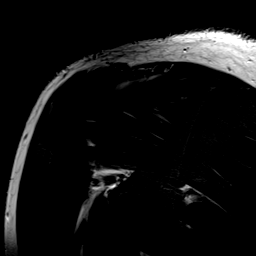
[im 4/19]
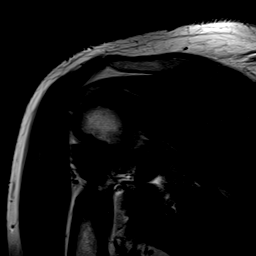
[im 7/19]
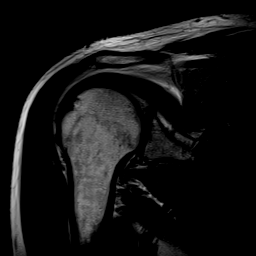
[im 10/19]
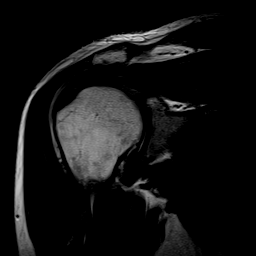
[im 13/19]
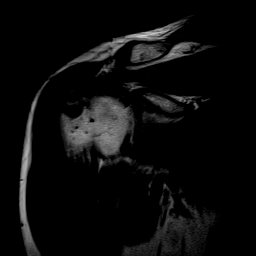
[im 16/19]
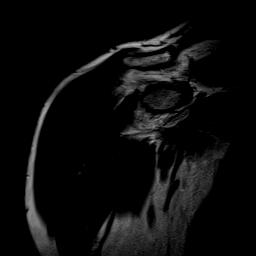
[im 19/19]
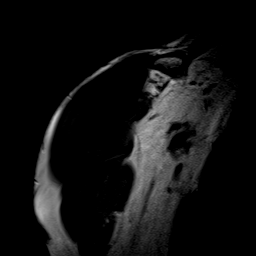

[Series 6: T1 · oblique · 4.0mm · 0.59mm/px · 8 of 22 slices shown]
[im 1/22]
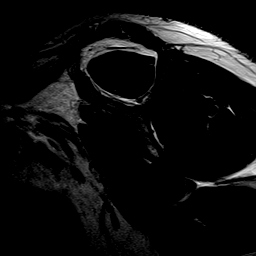
[im 4/22]
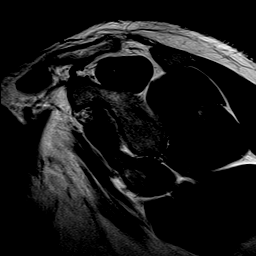
[im 7/22]
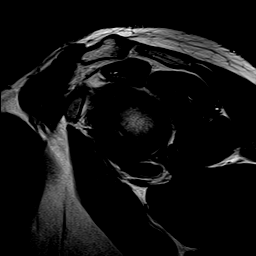
[im 10/22]
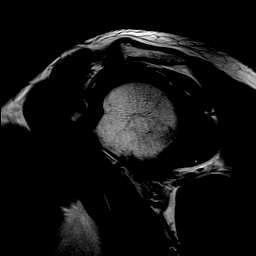
[im 13/22]
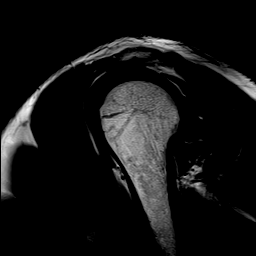
[im 16/22]
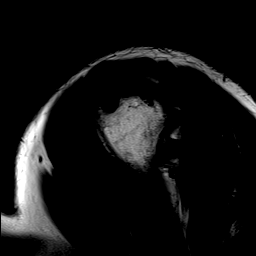
[im 19/22]
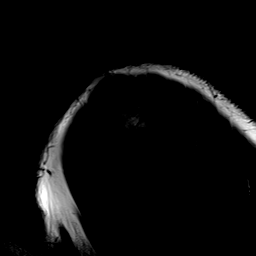
[im 22/22]
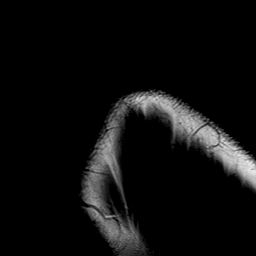

[Series 100: T2 · oblique · 4.0mm · 0.59mm/px · 8 of 22 slices shown]
[im 1/22]
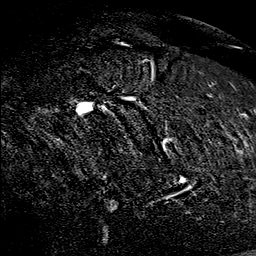
[im 4/22]
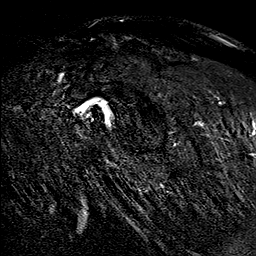
[im 7/22]
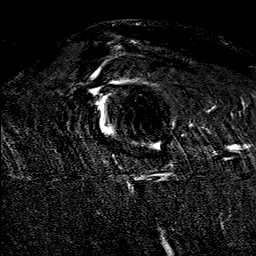
[im 10/22]
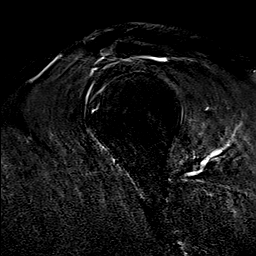
[im 13/22]
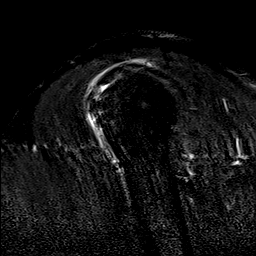
[im 16/22]
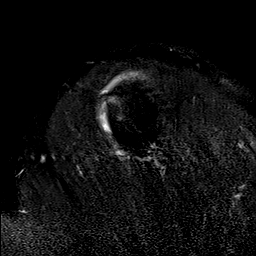
[im 19/22]
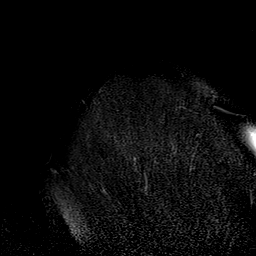
[im 22/22]
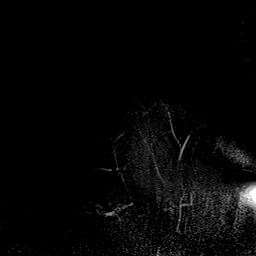

[40 of 40 positions shown; findings below may reference images not displayed]

FINDINGS: Rotator cuff: There is rotator cuff tendinopathy. A 0.5 cm from
front to back full-thickness tear of the leading edge of the
supraspinatus is identified. No retraction. The tear extends
posteriorly along the undersurface of the tendon for approximately
0.6 cm without retraction. There is also of partial tear of the
superior most fibers of the subscapularis involving the superficial
fibers of the tendon without retraction.

Muscles:  Normal appearance of atrophy or focal lesion.

Biceps long head: Tendinopathy of the intra-articular segment
without tear is identified.

Acromioclavicular Joint: Moderate degenerative disease is seen. Type
1 acromion. Fluid is present in the subacromial/subdeltoid bursa.

Glenohumeral Joint: Appears normal.

Labrum:  Intact.

Bones:  No fracture or worrisome lesion.

Other: None.
IMPRESSION: Rotator cuff tendinopathy with a 0.5 cm from front to back
full-thickness tear of the leading edge of the supraspinatus. The
tear extends posteriorly along the undersurface of the tendon for
approximately 0.6 cm. Partial tear of the superficial fibers of the
superior most subscapularis is also identified. There is no
retraction or atrophy related to either tear.

Tendinopathy of the intra-articular long head of biceps without
tear.

Moderate acromioclavicular osteoarthritis.

Subacromial/subdeltoid bursitis.

## 2019-04-06 ENCOUNTER — Ambulatory Visit: Payer: BC Managed Care – PPO | Admitting: Psychology

## 2019-04-26 ENCOUNTER — Ambulatory Visit (INDEPENDENT_AMBULATORY_CARE_PROVIDER_SITE_OTHER): Payer: BC Managed Care – PPO

## 2019-04-26 ENCOUNTER — Ambulatory Visit
Admission: EM | Admit: 2019-04-26 | Discharge: 2019-04-26 | Disposition: A | Discharge status: Home / Self Care | Payer: BC Managed Care – PPO | Attending: Emergency Medicine | Admitting: Emergency Medicine

## 2019-04-26 ENCOUNTER — Other Ambulatory Visit: Payer: Self-pay

## 2019-04-26 DIAGNOSIS — M545 Low back pain, unspecified: Secondary | ICD-10-CM

## 2019-04-26 DIAGNOSIS — R110 Nausea: Secondary | ICD-10-CM

## 2019-04-26 DIAGNOSIS — R1032 Left lower quadrant pain: Secondary | ICD-10-CM

## 2019-04-26 LAB — URINALYSIS, COMPLETE (UACMP) WITH MICROSCOPIC
Bilirubin Urine: NEGATIVE
Glucose, UA: NEGATIVE mg/dL
Hgb urine dipstick: NEGATIVE
Ketones, ur: NEGATIVE mg/dL
Leukocytes,Ua: NEGATIVE
Nitrite: NEGATIVE
Protein, ur: NEGATIVE mg/dL
Specific Gravity, Urine: >1.03 — ABNORMAL HIGH (ref 1.005–1.030)
Squamous Epithelial / LPF: NONE SEEN (ref 0–5)
pH: 5 (ref 5.0–8.0)

## 2019-04-26 MED ORDER — ACETAMINOPHEN 500 MG PO TABS
1000.0000 mg | ORAL_TABLET | Freq: Once | ORAL | Status: AC
  Administered 2019-04-26: 18:00:00 1000 mg via ORAL

## 2019-04-26 MED ORDER — TIZANIDINE HCL 4 MG PO TABS: 4.0000 mg | ORAL_TABLET | Freq: Three times a day (TID) | ORAL | 0 refills | Status: DC | PRN

## 2019-04-26 MED ORDER — KETOROLAC TROMETHAMINE 60 MG/2ML IM SOLN
30.0000 mg | Freq: Once | INTRAMUSCULAR | Status: AC
  Administered 2019-04-26: 30 mg via INTRAMUSCULAR

## 2019-04-26 NOTE — Discharge Instructions (Addendum)
Take the Mobic once a day.  You may take at 1000 mg of Tylenol 3-4 times a day.  Make sure you take the Mobic with Tylenol.  This is a very effective commendation for pain.  I am sending your urine off for culture to make sure you do not have a UTI.  Your x-ray was negative for kidney stone or any other abnormality.

## 2019-04-26 NOTE — ED Triage Notes (Addendum)
Patient presents to Urgent Care with complaints of left lower back pain since this morning. Patient reports he thinks it may be a kidney stone, pt denies urinary sx, denies taking pain meds today.  Pt was evaluated by his PCP this morning, given pain medication for his back pain.

## 2019-04-26 NOTE — ED Provider Notes (Signed)
HPI  SUBJECTIVE:  Anthony Stewart is a 44 y.o. male who presents with stabbing left low back pain starting this morning.  It is intermittent, lasting seconds to minutes.  He has had 2 episodes today.  States that it goes into his left flank/side.  It does not migrate.  He reports nausea, no vomiting, fevers.  No trauma to his back.  No change in physical activity.  No urinary complaints, abdominal pain.  No saddle anesthesia, distal weakness/numbness, bilateral radicular pain, weakness, urinary/ fecal incontinence, urinary retention.  Had a normal bowel movement yesterday.  He has not tried anything for this.  No alleviating factors.  Symptoms are worse with bending over, lifting.  No penile, testicular, perineal pain.  He saw his PMD for this this morning, was prescribed Mobic, but has not yet picked it up.  PMD has ordered a referral to orthopedics.  States that he has had pain in this area for some time, but states that it usually does not radiate.  He has a history of hypertension.  No history of UTI, pyelonephritis, nephrolithiasis, back injury, diabetes, constipation, prostatitis.  PMD: Dr. Kym Groom   Past Medical History:  Diagnosis Date  . Hypertension     History reviewed. No pertinent surgical history.  Family History  Problem Relation Age of Onset  . Healthy Mother     Social History   Tobacco Use  . Smoking status: Former Research scientist (life sciences)  . Smokeless tobacco: Never Used  Substance Use Topics  . Alcohol use: Yes    Alcohol/week: 16.0 standard drinks    Types: 16 Cans of beer per week    Comment: 4-6 beers per night.  . Drug use: No    No current facility-administered medications for this encounter.  Current Outpatient Medications:  .  amLODipine (NORVASC) 10 MG tablet, Take by mouth., Disp: , Rfl:  .  meloxicam (MOBIC) 15 MG tablet, Take 15 mg by mouth daily., Disp: , Rfl:  .  tiZANidine (ZANAFLEX) 4 MG tablet, Take 1 tablet (4 mg total) by mouth every 8 (eight) hours as needed  for muscle spasms., Disp: 30 tablet, Rfl: 0  No Known Allergies   ROS  As noted in HPI.   Physical Exam  BP (!) 148/101 (BP Location: Left Arm) Comment: DID take Bp medication today  Pulse 82   Temp 98.2 F (36.8 C) (Oral)   Resp 16   SpO2 100%   Constitutional: Well developed, well nourished, no acute distress Eyes:  EOMI, conjunctiva normal bilaterally HENT: Normocephalic, atraumatic,mucus membranes moist Respiratory: Normal inspiratory effort Cardiovascular: Normal rate GI: nondistended. No suprapubic tenderness skin: No rash, skin intact Musculoskeletal: no CVAT.  Tenderness superior to the posterior superior iliac crest.  No  paralumbar tenderness.  No appreciable muscle spasm. No bony tenderness. Bilateral lower extremities with intact DP / PT pulsesy. No pain with int/ext rotation flex/extension hips bilaterally. SLR neg bilaterally. Sensation baseline light touch bilaterally for Pt, DTR's symmetric and intact bilaterally KJ, Motor symmetric bilateral 5/5 hip flexion, quadriceps, hamstrings, EHL, foot dorsiflexion, foot plantarflexion, gait normal Neurologic: Alert & oriented x 3, no focal neuro deficits Psychiatric: Speech and behavior appropriate   ED Course   Medications  ketorolac (TORADOL) injection 30 mg (30 mg Intramuscular Given 04/26/19 1755)  acetaminophen (TYLENOL) tablet 1,000 mg (1,000 mg Oral Given 04/26/19 1755)    Orders Placed This Encounter  Procedures  . Urine culture    Standing Status:   Standing    Number of Occurrences:  1  . DG Abd 1 View    Standing Status:   Standing    Number of Occurrences:   1    Order Specific Question:   Reason for Exam (SYMPTOM  OR DIAGNOSIS REQUIRED)    Answer:   L sided back flank pain r/o stone  . Urinalysis, Complete w Microscopic    Standing Status:   Standing    Number of Occurrences:   1    Results for orders placed or performed during the hospital encounter of 04/26/19 (from the past 24 hour(s))   Urinalysis, Complete w Microscopic     Status: Abnormal   Collection Time: 04/26/19  4:21 PM  Result Value Ref Range   Color, Urine YELLOW YELLOW   APPearance CLEAR CLEAR   Specific Gravity, Urine >1.030 (H) 1.005 - 1.030   pH 5.0 5.0 - 8.0   Glucose, UA NEGATIVE NEGATIVE mg/dL   Hgb urine dipstick NEGATIVE NEGATIVE   Bilirubin Urine NEGATIVE NEGATIVE   Ketones, ur NEGATIVE NEGATIVE mg/dL   Protein, ur NEGATIVE NEGATIVE mg/dL   Nitrite NEGATIVE NEGATIVE   Leukocytes,Ua NEGATIVE NEGATIVE   Squamous Epithelial / LPF NONE SEEN 0 - 5   WBC, UA 0-5 0 - 5 WBC/hpf   RBC / HPF 0-5 0 - 5 RBC/hpf   Bacteria, UA RARE (A) NONE SEEN   Mucus PRESENT    DG Abd 1 View  Result Date: 04/26/2019 CLINICAL DATA:  Left-sided flank pain for several hours EXAM: ABDOMEN - 1 VIEW COMPARISON:  None. FINDINGS: Scattered large and small bowel gas is noted. No definitive renal calculi are seen. No ureteral stones are noted. No bony abnormality is seen. IMPRESSION: No acute abnormality noted. Electronically Signed   By: Inez Catalina M.D.   On: 04/26/2019 18:14    ED Clinical Impression  1. Acute left-sided low back pain without sciatica      ED Assessment/Plan  Outside records reviewed.  As noted in HPI  Patient prescribed Mobic by PMD today.  Ortho referral made.  He has a few bacteria in his urine, but no hematuria.  However will get a KUB to rule out stone.  Discussed with patient this does not necessarily rule out stone, however the location of his pain is not entirely consistent with nephrolithiasis.  Suspect musculoskeletal pain.  Will give Toradol 30, Tylenol 1 g.  Sending urine culture off due to the few bacteria in the urine.  He declined a rectal exam to evaluate for prostatitis.  Imaging independently reviewed.  No nephrolithiasis.  Normal KUB.  See radiology report for details.  Plan to send home with Zanaflex. he is to take the Mobic 15 mg as prescribed to him by his PMD and follow-up with  orthopedics.     Discussed labs, imaging  medical decision-making, and plan for follow-up with the patient.  Discussed signs and symptoms that should prompt return to the emergency department.  Patient agrees with plan.  Meds ordered this encounter  Medications  . ketorolac (TORADOL) injection 30 mg  . acetaminophen (TYLENOL) tablet 1,000 mg  . tiZANidine (ZANAFLEX) 4 MG tablet    Sig: Take 1 tablet (4 mg total) by mouth every 8 (eight) hours as needed for muscle spasms.    Dispense:  30 tablet    Refill:  0    *This clinic note was created using Lobbyist. Therefore, there may be occasional mistakes despite careful proofreading.  ?     Melynda Ripple, MD 04/27/19  [phone]  

## 2019-04-28 LAB — URINE CULTURE: Culture: <10000 — AB

## 2019-05-09 ENCOUNTER — Ambulatory Visit: Payer: BC Managed Care – PPO | Admitting: Psychology

## 2019-08-09 ENCOUNTER — Other Ambulatory Visit: Payer: Self-pay

## 2019-08-09 ENCOUNTER — Encounter: Payer: Self-pay | Admitting: Emergency Medicine

## 2019-08-09 ENCOUNTER — Ambulatory Visit
Admission: EM | Admit: 2019-08-09 | Discharge: 2019-08-09 | Disposition: A | Discharge status: Home / Self Care | Payer: BC Managed Care – PPO | Attending: Emergency Medicine | Admitting: Emergency Medicine

## 2019-08-09 DIAGNOSIS — H5789 Other specified disorders of eye and adnexa: Secondary | ICD-10-CM | POA: Diagnosis not present

## 2019-08-09 MED ORDER — TETRACAINE HCL 0.5 % OP SOLN
1.0000 [drp] | Freq: Once | OPHTHALMIC | Status: AC
  Administered 2019-08-09: 1 [drp] via OPHTHALMIC

## 2019-08-09 MED ORDER — SYSTANE 0.4-0.3 % OP SOLN: 1.0000 [drp] | Freq: Four times a day (QID) | OPHTHALMIC | 0 refills | Status: AC | PRN

## 2019-08-09 MED ORDER — FLUORESCEIN SODIUM 1 MG OP STRP
1.0000 | ORAL_STRIP | Freq: Once | OPHTHALMIC | Status: AC
  Administered 2019-08-09: 1 via OPHTHALMIC

## 2019-08-09 MED ORDER — KETOROLAC TROMETHAMINE 0.5 % OP SOLN: OPHTHALMIC | 0 refills | Status: DC

## 2019-08-09 NOTE — Discharge Instructions (Addendum)
Cool compresses.  Systane as often as you want.  The ketorolac eyedrops will help with inflammation.  Follow-up with Dr. Edison Pace if you are not better in 24 hours.  Go to the ER if you get worse.

## 2019-08-09 NOTE — ED Triage Notes (Signed)
Patient states he got saw dust in his right eye on Monday and he continues to have pain in his right eye and irritation.

## 2019-08-09 NOTE — ED Provider Notes (Signed)
HPI  SUBJECTIVE:  Anthony Stewart is a 44 y.o. male who presents with 2 days of right eye irritation after getting some sawdust in it.  States that it "watered a lot" yesterday.  He has a foreign body sensation that he states moves around in his eye.  He reports gritty eye pain starting last night and a mild periorbital headache starting today.  He reports occasional blurry vision, increased tearing and conjunctival injection.  No fevers, double vision, crusting, purulent drainage.  No periorbital erythema, edema.  Does not wear glasses or contacts.  No photophobia.  States it is allergies are not bothering him.  He tried irrigation and removing sawdust with a Q-tip without improvement in his symptoms.  Symptoms worse with moving his eye, blinking.  He has a past medical history of hypertension, states that he took his medications this morning.  No history of diabetes.  Tetanus is up-to-date.  PMD: [facility].  Ophthalmology: None.    Past Medical History:  Diagnosis Date  . Hypertension     History reviewed. No pertinent surgical history.  Family History  Problem Relation Age of Onset  . Healthy Mother     Social History   Tobacco Use  . Smoking status: Former Research scientist (life sciences)  . Smokeless tobacco: Never Used  Substance Use Topics  . Alcohol use: Yes    Alcohol/week: 16.0 standard drinks    Types: 16 Cans of beer per week    Comment: 4-6 beers per night.  . Drug use: No    No current facility-administered medications for this encounter.  Current Outpatient Medications:  .  amLODipine (NORVASC) 10 MG tablet, Take by mouth., Disp: , Rfl:  .  ketorolac (ACULAR) 0.5 % ophthalmic solution, 1 drop in affected eye 4 times a day x 5 days, Disp: 3 mL, Rfl: 0 .  Polyethyl Glycol-Propyl Glycol (SYSTANE) 0.4-0.3 % SOLN, Apply 1 drop to eye 4 (four) times daily as needed., Disp: 5 mL, Rfl: 0  No Known Allergies   ROS  As noted in HPI.   Physical Exam  BP (!) 166/104 (BP  Location: Right Arm)   Pulse 90   Temp 98 F (36.7 C) (Oral)   Resp 18   Ht 5\' 10"  (1.778 m)   Wt 90.7 kg   SpO2 98%   BMI 28.70 kg/m   Constitutional: Well developed, well nourished, no acute distress Eyes:  EOMI, PERRLA.  Cornea clear.  Positive right-sided conjunctival injection.  No crusting, purulent drainage.  No foreign body seen on upper or lower lid eversion under magnification.  No direct photophobia.  Positive mild consensual photophobia.  No corneal abrasion seen on fluorescein.  No hyphema.  No periorbital erythema, edema.    Visual Acuity  Right Eye Distance: 20/25(uncorrected) Left Eye Distance: 20/20(uncorrected) Bilateral Distance: 20/20(uncorrected)  Right Eye Near:   Left Eye Near:    Bilateral Near:    HENT: Normocephalic, atraumatic,mucus membranes moist Respiratory: Normal inspiratory effort Cardiovascular: Normal rate GI: nondistended skin: No rash, skin intact Musculoskeletal: no deformities Neurologic: Alert & oriented x 3, no focal neuro deficits Psychiatric: Speech and behavior appropriate    ED Course   Medications  fluorescein ophthalmic strip 1 strip (1 strip Right Eye Given 08/09/19 0930)  tetracaine (PONTOCAINE) 0.5 % ophthalmic solution 1 drop (1 drop Right Eye Given 08/09/19 0930)    Orders Placed This Encounter  Procedures  . Visual acuity screening    Standing Status:   Standing  Number of Occurrences:   1    No results found for this or any previous visit (from the past 24 hour(s)). No results found.  ED Clinical Impression  1. Eye irritation      ED Assessment/Plan  Blood pressure noted.  Patient states that he took his medications this morning.  However he appears rather uncomfortable.  Suspect elevated blood pressures from this.  He will need to keep an eye on it.  Discussed this with patient.  Discussed signs and symptoms of hypertensive emergency and reasons to go to the ED.  Patient reports significant relief with  the tetracaine.  Visual acuity slightly unequal, but still acceptable.  I do not see any evidence of a hyphema, foreign body under the lid or in the cornea, corneal abrasion.  While he has no direct photophobia, he has consensual photophobia, so iritis in the differential.  Suspect inflammation/irritation in his eye from sawdust.  I did not see anything to remove.  Will send home with ketorolac eyedrops, Systane, cool compresses.  Follow-up with ophthalmology if not better in 24 hours to rule out iritis.  Dr. Edison Pace on-call.  Discussed  MDM, treatment plan, and plan for follow-up with patient. patient agrees with plan.   Meds ordered this encounter  Medications  . fluorescein ophthalmic strip 1 strip  . tetracaine (PONTOCAINE) 0.5 % ophthalmic solution 1 drop  . ketorolac (ACULAR) 0.5 % ophthalmic solution    Sig: 1 drop in affected eye 4 times a day x 5 days    Dispense:  3 mL    Refill:  0  . Polyethyl Glycol-Propyl Glycol (SYSTANE) 0.4-0.3 % SOLN    Sig: Apply 1 drop to eye 4 (four) times daily as needed.    Dispense:  5 mL    Refill:  0    *This clinic note was created using Lobbyist. Therefore, there may be occasional mistakes despite careful proofreading.   ?    Melynda Ripple, MD 08/09/19 [phone]

## 2020-11-12 DIAGNOSIS — R7303 Prediabetes: Secondary | ICD-10-CM | POA: Insufficient documentation

## 2020-12-27 IMAGING — CR DG ABDOMEN 1V
2 series · 2 of 2 positions shown · non-contrast
Comparison: None.

CLINICAL DATA: Left-sided flank pain for several hours

EXAM:
ABDOMEN - 1 VIEW

[abdomen kub (1 of 2)]
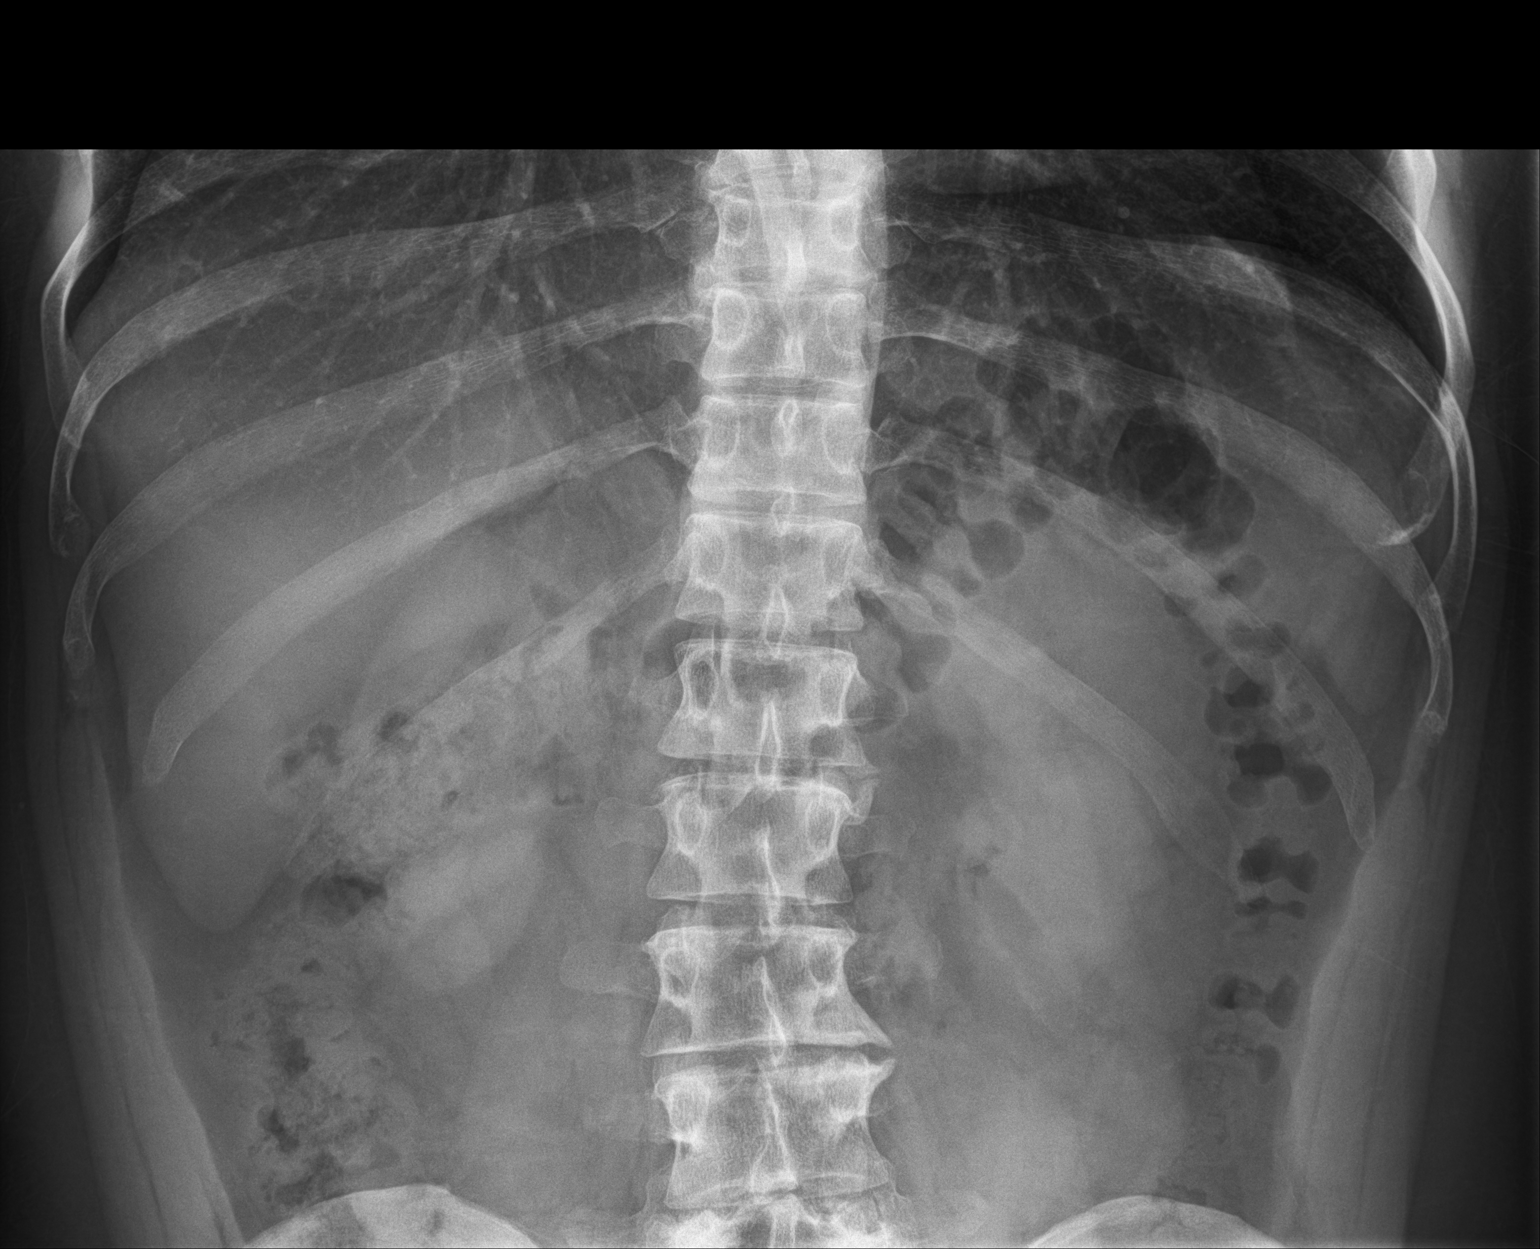

[abdomen kub (2 of 2)]
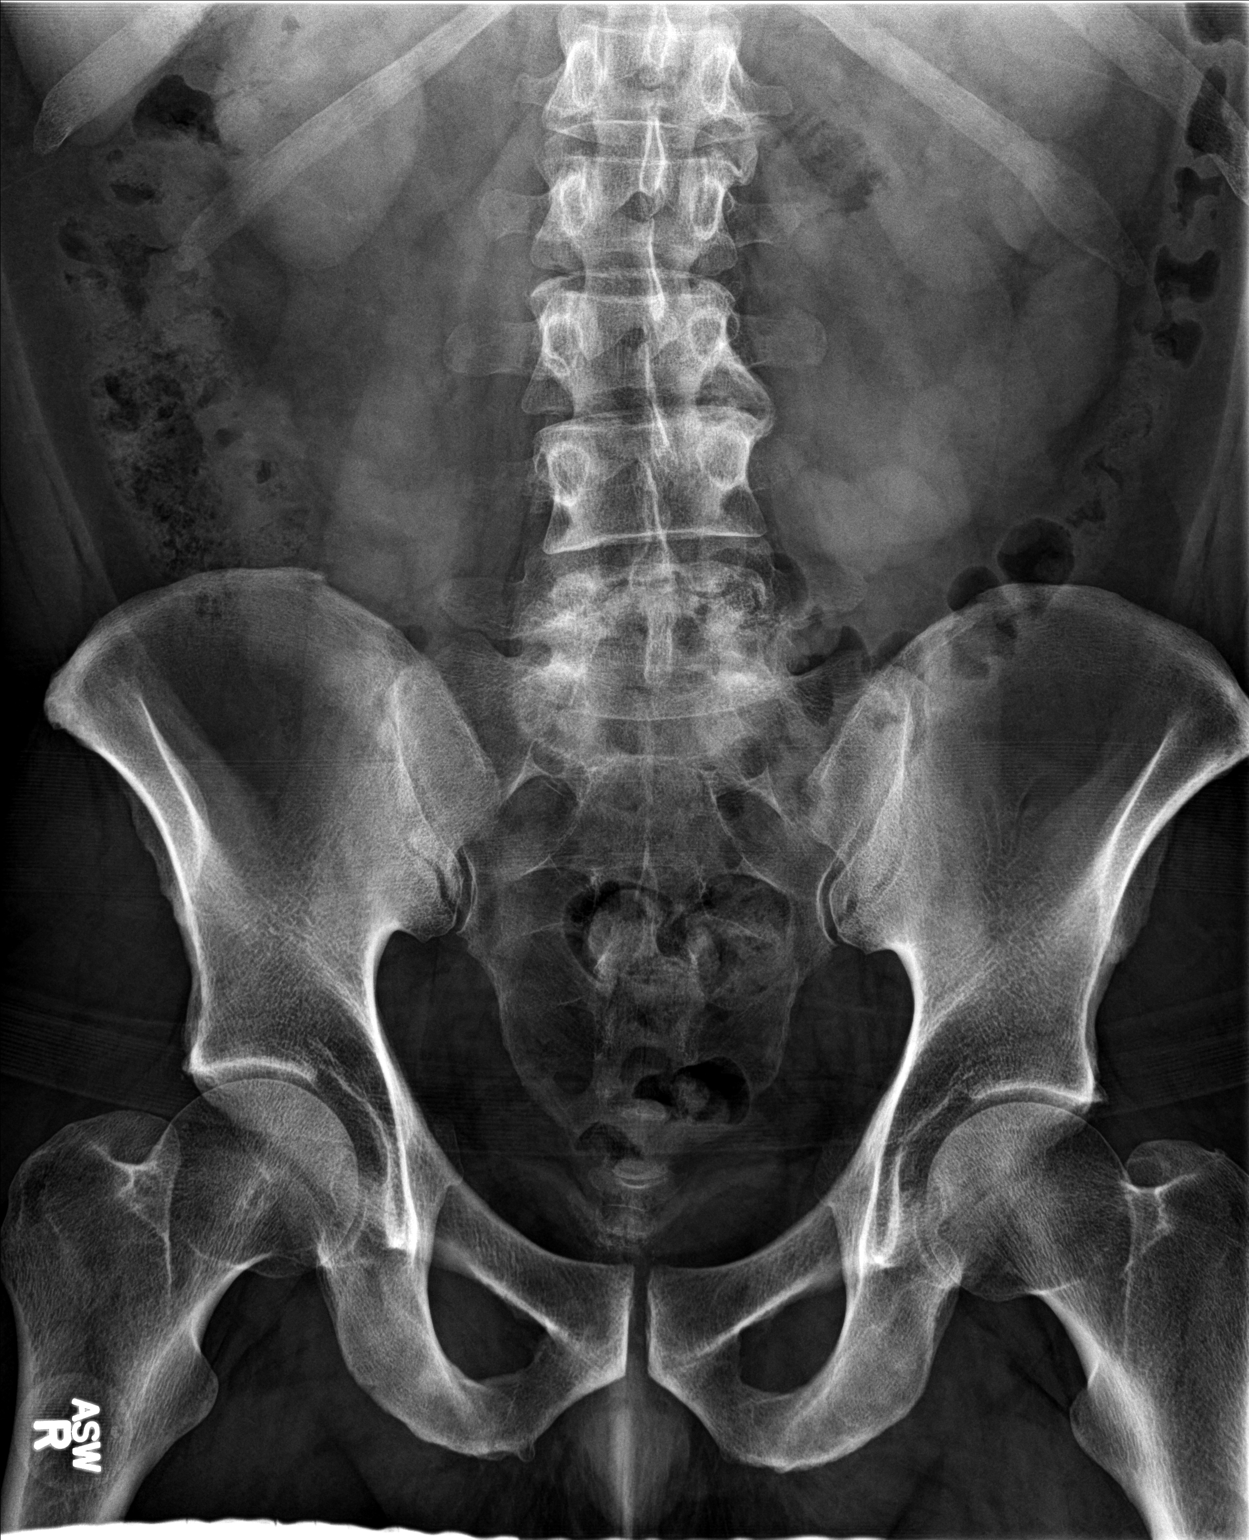

[2 of 2 positions shown; findings below may reference images not displayed]

FINDINGS: Scattered large and small bowel gas is noted. No definitive renal
calculi are seen. No ureteral stones are noted. No bony abnormality
is seen.
IMPRESSION: No acute abnormality noted.

## 2022-12-09 ENCOUNTER — Encounter: Payer: Self-pay | Admitting: Family

## 2022-12-09 ENCOUNTER — Ambulatory Visit: Payer: BC Managed Care – PPO | Admitting: Family

## 2022-12-09 VITALS — BP 172/120 | HR 92 | Ht 70.0 in | Wt 224.4 lb

## 2022-12-09 DIAGNOSIS — R7303 Prediabetes: Secondary | ICD-10-CM

## 2022-12-09 DIAGNOSIS — R5383 Other fatigue: Secondary | ICD-10-CM

## 2022-12-09 DIAGNOSIS — M25511 Pain in right shoulder: Secondary | ICD-10-CM

## 2022-12-09 DIAGNOSIS — I10 Essential (primary) hypertension: Secondary | ICD-10-CM | POA: Diagnosis not present

## 2022-12-09 DIAGNOSIS — B351 Tinea unguium: Secondary | ICD-10-CM

## 2022-12-09 DIAGNOSIS — M5441 Lumbago with sciatica, right side: Secondary | ICD-10-CM

## 2022-12-09 DIAGNOSIS — G8929 Other chronic pain: Secondary | ICD-10-CM

## 2022-12-09 DIAGNOSIS — E538 Deficiency of other specified B group vitamins: Secondary | ICD-10-CM | POA: Diagnosis not present

## 2022-12-09 DIAGNOSIS — M25512 Pain in left shoulder: Secondary | ICD-10-CM

## 2022-12-09 DIAGNOSIS — E782 Mixed hyperlipidemia: Secondary | ICD-10-CM | POA: Diagnosis not present

## 2022-12-09 DIAGNOSIS — E559 Vitamin D deficiency, unspecified: Secondary | ICD-10-CM

## 2022-12-09 DIAGNOSIS — M5442 Lumbago with sciatica, left side: Secondary | ICD-10-CM

## 2022-12-09 MED ORDER — LOSARTAN POTASSIUM 50 MG PO TABS: 50.0000 mg | ORAL_TABLET | Freq: Every day | ORAL | 0 refills | Status: AC

## 2022-12-09 MED ORDER — AMLODIPINE BESYLATE 5 MG PO TABS: 5.0000 mg | ORAL_TABLET | Freq: Every day | ORAL | 0 refills | Status: DC

## 2022-12-09 MED ORDER — KETOCONAZOLE 200 MG PO TABS: 200.0000 mg | ORAL_TABLET | Freq: Every day | ORAL | 0 refills | Status: AC

## 2022-12-09 MED ORDER — CELECOXIB 100 MG PO CAPS: 100.0000 mg | ORAL_CAPSULE | Freq: Two times a day (BID) | ORAL | 0 refills | Status: AC

## 2022-12-09 MED ORDER — WRIST BLOOD PRESSURE MONITOR MISC: 1.0000 | 0 refills | Status: AC | PRN

## 2022-12-09 NOTE — Progress Notes (Signed)
New Patient Office Visit  Subjective    Patient ID: Anthony Stewart, male    DOB: 1975-10-06  Age: 47 y.o. MRN: [id number]  CC:  Chief Complaint  Patient presents with   Establish Care    NPE    HPI [name] presents to establish care Previous Primary Care provider/office:  Dr. Adriana Simas  he does have additional concerns to discuss today.   Shoulder Pain  The pain is present in the left shoulder and right shoulder. This is a chronic problem. The current episode started more than 1 year ago. There has been a history of trauma. The problem occurs constantly. The problem has been waxing and waning. The quality of the pain is described as aching. The pain is severe. Associated symptoms include numbness and tingling. The symptoms are aggravated by activity. He has tried acetaminophen, NSAIDS, heat, cold, movement, OTC pain meds, OTC ointments and rest for the symptoms. The treatment provided mild relief.  Back Pain This is a chronic problem. The current episode started more than 1 year ago. The problem occurs constantly. The problem has been waxing and waning since onset. The pain is present in the lumbar spine. The quality of the pain is described as shooting. The pain radiates to the left knee and right knee. The pain is severe. The symptoms are aggravated by bending, twisting and position. Associated symptoms include headaches, numbness, paresthesias and tingling. Risk factors include obesity. He has tried analgesics, heat, bed rest, home exercises, muscle relaxant, NSAIDs, chiropractic manipulation, ice and walking for the symptoms. The treatment provided no relief.  Hypertension This is a chronic problem. The current episode started more than 1 year ago. The problem has been waxing and waning since onset. The problem is uncontrolled. Associated symptoms include headaches. There are no associated agents to hypertension. Risk factors for coronary artery disease include family history and male  gender. Past treatments include ACE inhibitors, diuretics, lifestyle changes and calcium channel blockers. The current treatment provides mild improvement. Compliance problems include medication cost.   Toenail Problem Chronic problem. The patient has had this for a while, he cannot remember exactly when it started. Multiple nails are discolored (yellowish) and thickened.He has tried some over the counter remedies without any benefit. Asks if there is anything we can do to help with this.  Outpatient Encounter Medications as of 12/09/2022  Medication Sig   amLODipine (NORVASC) 5 MG tablet Take 1 tablet (5 mg total) by mouth daily.   Blood Pressure Monitoring (WRIST BLOOD PRESSURE MONITOR) MISC 1 each by Does not apply route as needed (Check blood pressure daily).   celecoxib (CELEBREX) 100 MG capsule Take 1 capsule (100 mg total) by mouth 2 (two) times daily.   ketoconazole (NIZORAL) 200 MG tablet Take 1 tablet (200 mg total) by mouth daily.   losartan (COZAAR) 50 MG tablet Take 1 tablet (50 mg total) by mouth daily.   ketorolac (ACULAR) 0.5 % ophthalmic solution 1 drop in affected eye 4 times a day x 5 days (Patient not taking: Reported on 12/09/2022)   Polyethyl Glycol-Propyl Glycol (SYSTANE) 0.4-0.3 % SOLN Apply 1 drop to eye 4 (four) times daily as needed. (Patient not taking: Reported on 12/09/2022)   [DISCONTINUED] amLODipine (NORVASC) 10 MG tablet Take 1 tablet by mouth daily. (Patient not taking: Reported on 12/09/2022)   [DISCONTINUED] lisinopril (ZESTRIL) 10 MG tablet Take 1 tablet by mouth daily. (Patient not taking: Reported on 12/09/2022)   No facility-administered encounter medications on file as of  12/09/2022.    Past Medical History:  Diagnosis Date   Hypertension     History reviewed. No pertinent surgical history.  Family History  Problem Relation Age of Onset   Healthy Mother     Social History   Socioeconomic History   Marital status: Married    Spouse name: Not on  file   Number of children: Not on file   Years of education: Not on file   Highest education level: Not on file  Occupational History   Not on file  Tobacco Use   Smoking status: Former   Smokeless tobacco: Never  Vaping Use   Vaping status: Never Used  Substance and Sexual Activity   Alcohol use: Yes    Alcohol/week: 16.0 standard drinks of alcohol    Types: 16 Cans of beer per week    Comment: 4-6 beers per night.   Drug use: No   Sexual activity: Not on file  Other Topics Concern   Not on file  Social History Narrative   Lives in [place] with wife.      Work - Nutritional therapist      Social Determinants of Corporate investment banker Strain: Not on [employer] Insecurity: Not on file  Transportation Needs: Not on file  Physical Activity: Not on file  Stress: Not on file  Social Connections: Not on file  Intimate Partner Violence: Not on file    Review of Systems  Musculoskeletal:  Positive for back pain.  Neurological:  Positive for tingling, numbness, headaches and paresthesias.  All other systems reviewed and are negative.       Objective    BP (!) 172/120   Pulse 92   Ht 5\' 10"  (1.778 m)   Wt 224 lb 6.4 oz (101.8 kg)   SpO2 98%   BMI 32.20 kg/m   Physical Exam Vitals and nursing note reviewed.  Constitutional:      Appearance: Normal appearance. He is normal weight.  Eyes:     Pupils: Pupils are equal, round, and reactive to light.  Cardiovascular:     Rate and Rhythm: Normal rate and regular rhythm.     Pulses: Normal pulses.     Heart sounds: Normal heart sounds.  Pulmonary:     Effort: Pulmonary effort is normal.     Breath sounds: Normal breath sounds.  Musculoskeletal:     Right shoulder: Tenderness and crepitus present. Decreased range of motion.     Left shoulder: Tenderness and crepitus present. Decreased range of motion.     Lumbar back: Tenderness present. Positive right straight leg raise test and positive left straight leg raise test.   Neurological:     General: No focal deficit present.     Mental Status: He is alert and oriented to person, place, and time. Mental status is at baseline.  Psychiatric:        Mood and Affect: Mood normal.        Behavior: Behavior normal.        Thought Content: Thought content normal.        Judgment: Judgment normal.        Assessment & Plan:   Problem List Items Addressed This Visit       Active Problems   Chronic pain of both shoulders    Setting up referral to Ortho for patinet. Will defer to them for further treatment changes.   Recheck at follow up.       Relevant Medications  celecoxib (CELEBREX) 100 MG capsule   Other Relevant Orders   Ambulatory referral to Orthopedic Surgery   Essential hypertension, benign    Sending some refills for his blood pressure medications.  Will reassess at follow up to see about increasing his dosing.       Relevant Medications   amLODipine (NORVASC) 5 MG tablet   losartan (COZAAR) 50 MG tablet   Other Relevant Orders   CMP14+EGFR (Completed)   CBC with Differential/Platelet   Hypertriglyceridemia    Checking labs today.  Continue current therapy for lipid control. Will modify as needed based on labwork results.       Relevant Medications   amLODipine (NORVASC) 5 MG tablet   losartan (COZAAR) 50 MG tablet   Prediabetes    Patient educated on foods that contain carbohydrates and the need to decrease intake.  We discussed prediabetes, and what it means and the need for strict dietary control to prevent progression to type 2 diabetes.  Advised to decrease intake of sugary drinks, including sodas, sweet tea, and some juices, and of starch and sugar heavy foods (ie., potatoes, rice, bread, pasta, desserts). He verbalizes understanding and agreement with the changes discussed today.  A1C Continues to be in prediabetic ranges.  Will reassess at follow up after next lab check.  Patient counseled on dietary choices and  verbalized understanding.        Relevant Orders   CMP14+EGFR (Completed)   Hemoglobin A1c (Completed)   CBC with Differential/Platelet   Chronic bilateral low back pain with bilateral sciatica    Setting up for referral to neurosurgery given his continuous issues.  Also sending celebrex to see if this is helpful for his pain.   Will recheck at follow up.        Relevant Medications   celecoxib (CELEBREX) 100 MG capsule   Other Relevant Orders   Ambulatory referral to Neurosurgery   Other Visit Diagnoses     B12 deficiency due to diet    -  Primary   Relevant Orders   CMP14+EGFR (Completed)   Vitamin B12 (Completed)   CBC with Differential/Platelet   Vitamin D deficiency, unspecified       Relevant Orders   VITAMIN D 25 Hydroxy (Vit-D Deficiency, Fractures) (Completed)   CMP14+EGFR (Completed)   CBC with Differential/Platelet   Other fatigue       Relevant Orders   CMP14+EGFR (Completed)   TSH (Completed)   CBC with Differential/Platelet   Onychomycosis       Relevant Medications   ketoconazole (NIZORAL) 200 MG tablet       Return in about 2 weeks (around 12/23/2022) for F/U.   Total time spent: 45 minutes  Miki Kins, FNP  12/09/2022  This document may have been prepared by [facility] Voice Recognition software and as such may include unintentional dictation errors.

## 2022-12-10 LAB — CMP14+EGFR
ALT: 28 IU/L (ref 0–44)
AST: 29 IU/L (ref 0–40)
Albumin: 4.5 g/dL (ref 4.1–5.1)
Alkaline Phosphatase: 82 IU/L (ref 44–121)
BUN/Creatinine Ratio: 12 (ref 9–20)
BUN: 11 mg/dL (ref 6–24)
Bilirubin Total: 0.6 mg/dL (ref 0.0–1.2)
CO2: 24 mmol/L (ref 20–29)
Calcium: 10.4 mg/dL — ABNORMAL HIGH (ref 8.7–10.2)
Chloride: 100 mmol/L (ref 96–106)
Creatinine, Ser: 0.92 mg/dL (ref 0.76–1.27)
Globulin, Total: 2.8 g/dL (ref 1.5–4.5)
Glucose: 127 mg/dL — ABNORMAL HIGH (ref 70–99)
Potassium: 4.2 mmol/L (ref 3.5–5.2)
Sodium: 139 mmol/L (ref 134–144)
Total Protein: 7.3 g/dL (ref 6.0–8.5)
eGFR: 105 mL/min/{1.73_m2} (ref >59)

## 2022-12-10 LAB — TSH: TSH: 1.09 u[IU]/mL (ref 0.450–4.500)

## 2022-12-10 LAB — LIPID PANEL
Chol/HDL Ratio: 4.5 ratio (ref 0.0–5.0)
Cholesterol, Total: 239 mg/dL — ABNORMAL HIGH (ref 100–199)
HDL: 53 mg/dL (ref >39)
LDL Chol Calc (NIH): 138 mg/dL — ABNORMAL HIGH (ref 0–99)
Triglycerides: 268 mg/dL — ABNORMAL HIGH (ref 0–149)
VLDL Cholesterol Cal: 48 mg/dL — ABNORMAL HIGH (ref 5–40)

## 2022-12-10 LAB — VITAMIN D 25 HYDROXY (VIT D DEFICIENCY, FRACTURES): Vit D, 25-Hydroxy: 13.1 ng/mL — ABNORMAL LOW (ref 30.0–100.0)

## 2022-12-10 LAB — HEMOGLOBIN A1C
Est. average glucose Bld gHb Est-mCnc: 134 mg/dL
Hgb A1c MFr Bld: 6.3 % — ABNORMAL HIGH (ref 4.8–5.6)

## 2022-12-10 LAB — VITAMIN B12: Vitamin B-12: 581 pg/mL (ref 232–1245)

## 2022-12-17 NOTE — Progress Notes (Addendum)
Referring Physician:  Miki Kins, FNP [address] [place],  [place] [address]  Primary Physician:  Miki Kins, FNP  History of Present Illness: 12/23/2022 [name] has a history of HTN, hyperlipidemia.   Has seen PCP for upper back pain that started after heavy lifting at work. He was given motrin and zanaflex.   He has constant lower back pain with bilateral lateral leg pain to his calf for the last year or so. Right leg pain = left leg pain. He has stiffness and soreness in his back. No specific aggravating or alleviating factors. No numbness, tingling. He notes some weakness in both legs.   Bowel/Bladder Dysfunction: none  He smokes 1 pack per week x 8 months.   Conservative measures:  Physical therapy: none Multimodal medical therapy including regular antiinflammatories: motrin, zanaflex, celebrex.  Injections: No epidural steroid injections  Past Surgery: no spinal surgery  Emauri Sundin has no symptoms of cervical myelopathy.  The symptoms are causing a significant impact on the patient's life.   Review of Systems:  A 10 point review of systems is negative, except for the pertinent positives and negatives detailed in the HPI.  Past Medical History: Past Medical History:  Diagnosis Date   Hypertension     Past Surgical History: No past surgical history on file.  Allergies: Allergies as of 12/23/2022   (No Known Allergies)    Medications: Outpatient Encounter Medications as of 12/23/2022  Medication Sig   amLODipine (NORVASC) 5 MG tablet Take 1 tablet (5 mg total) by mouth daily.   Blood Pressure Monitoring (WRIST BLOOD PRESSURE MONITOR) MISC 1 each by Does not apply route as needed (Check blood pressure daily).   celecoxib (CELEBREX) 100 MG capsule Take 1 capsule (100 mg total) by mouth 2 (two) times daily.   ketoconazole (NIZORAL) 200 MG tablet Take 1 tablet (200 mg total) by mouth daily.   ketorolac (ACULAR) 0.5 % ophthalmic solution  1 drop in affected eye 4 times a day x 5 days (Patient not taking: Reported on 12/09/2022)   losartan (COZAAR) 50 MG tablet Take 1 tablet (50 mg total) by mouth daily.   Polyethyl Glycol-Propyl Glycol (SYSTANE) 0.4-0.3 % SOLN Apply 1 drop to eye 4 (four) times daily as needed. (Patient not taking: Reported on 12/09/2022)   No facility-administered encounter medications on file as of 12/23/2022.    Social History: Social History   Tobacco Use   Smoking status: Every Day    Types: Cigarettes   Smokeless tobacco: Never   Tobacco comments:    1 pack a week   Vaping Use   Vaping status: Never Used  Substance Use Topics   Alcohol use: Yes    Alcohol/week: 16.0 standard drinks of alcohol    Types: 16 Cans of beer per week    Comment: 4-6 beers per night.   Drug use: No    Family Medical History: Family History  Problem Relation Age of Onset   Healthy Mother     Physical Examination: Vitals:   12/23/22 1005  BP: (!) 140/100    General: Patient is well developed, well nourished, calm, collected, and in no apparent distress. Attention to examination is appropriate.  Respiratory: Patient is breathing without any difficulty.   NEUROLOGICAL:     Awake, alert, oriented to person, place, and time.  Speech is clear and fluent. Fund of knowledge is appropriate.   Cranial Nerves: Pupils equal round and reactive to light.  Facial tone is  symmetric.    No posterior lumbar tenderness.   No abnormal lesions on exposed skin.   Strength: Side Biceps Triceps Deltoid Interossei Grip Wrist Ext. Wrist Flex.  R 5 5 5 5 5 5 5   L 5 5 5 5 5 5 5    Side Iliopsoas Quads Hamstring PF DF EHL  R 5 5 5 5 5 5   L 5 5 5 5 5 5    Reflexes are 2+ and symmetric at the biceps, brachioradialis, patella.   Hoffman's is absent.  Clonus is not present.   Bilateral upper and lower extremity sensation is intact to light touch.     No pain with IR/ER of his hips bilaterally.   Gait is normal.      Medical Decision Making  Imaging: Lumbar MRI from 05/03/19:  FINDINGS:   Grade 1 anterolisthesis of L5 on S1 with associated bilateral pars defects.  Minimal retrolisthesis of L3 on L4.  The lumbar vertebral body heights are maintained.  The conus medullaris is positioned at the L1-L2 level.  Multilevel degenerative endplate changes, most advanced at L3-L4 and L5-S1.   Numbering of the lumbar segments is predicated upon the presence of five  lumbar type vertebrae.   T12-L1: No disc herniation. No spinal canal or neural foraminal narrowing.   L1-L2: Small diffuse disc bulge, minimally effacing the ventral CSF. No  spinal canal or neural foraminal narrowing.   L2-L3: Small diffuse disc bulge, minimally effacing the ventral CSF. No  spinal canal or neural foraminal narrowing.   L3-L4: Disc osteophyte complex, slightly eccentric to the left. Mild spinal  canal narrowing. Bilateral facet arthrosis. Mild left and no significant  right neural foraminal narrowing.   L4-L5: Small diffuse disc bulge, minimally effacing the ventral CSF. Mild  facet arthrosis. No spinal canal or neural foraminal narrowing.   L5-S1: Grade 1 anterolisthesis with uncovering of the disc and diffuse disc  bulge. The disc bulge abuts the descending right S1 nerve root. No spinal  canal narrowing. Bilateral facet arthrosis. Mild left and severe right  neural foraminal narrowing.   IMPRESSION:  1. Multilevel lumbar spondylosis, most advanced at L5-S1, where grade 1  anterolisthesis with bilateral spondylolysis, uncovering of the disc and  diffuse disc bulge contribute to severe right neural foraminal narrowing.  2.  No high-grade spinal canal narrowing.   Electronically Reviewed by:  Jeraldine Loots, MD, [facility]  Electronically Reviewed on:  05/03/2019 12:08 PM   I have reviewed the images and concur with the above findings.   Electronically Signed by:  Barnetta Hammersmith, MD, [facility]   Electronically Signed on:  05/03/2019 5:02 PM    Lumbar xrays from 04/28/19:  Minimal dextroscoliosis..    Moderate multilevel degenerative disc disease with variable loss of disc  space height and mild to moderate osteophytes.   Severe L5-S1 degenerative disc disease with loss of disc space height and  small osteophytes. Bilateral L5 pars defects with Grade 1 anterolisthesis   L3-4 grade 1 retrolisthesis..   No acute fracture.    No instability on flexion and extension views.   Normal bowel gas pattern.   Electronically Signed by:  Tana Conch, MD, [facility]  Electronically Signed on:  04/28/2019 11:08 AM   I have personally reviewed the images and agree with the above interpretation.  Assessment and Plan: Mr. Beougher is a pleasant 47 y.o. male who has constant lower back pain with bilateral lateral leg pain to his calf for the last year or  so. Right leg pain = left leg pain. No numbness, tingling. He notes some weakness in both legs.   Imaging from 2021 shows slip at L5-S1 with DDD and severe right/mild left foraminal stenosis. Also with lumbar spondylosis.   LBP likely multifactorial, but leg pain and most of his symptoms are likely from L5-S1.   Treatment options discussed with patient and following plan made:   - Lumbar xrays ordered. Will message him with results.  - Order for physical therapy for lumbar spine to [facility]. Patient to call to schedule appointment.  - Discussed getting updated lumbar MRI. He is not interested in any injections at this point so will hold off. Will revisit if no improvement with PT.  - Follow up with me in 6-8 weeks and prn.   I spent a total of 30 minutes in face-to-face and non-face-to-face activities related to this patient's care today including review of outside records, review of imaging, review of symptoms, physical exam, discussion of differential diagnosis, discussion of treatment options, and documentation.   Thank you for  involving me in the care of this patient.   ADDENDUM 01/01/23:  Lumbar xrays dated 12/23/22:  FINDINGS: Disc space narrowing with marginal osteophytes at each lumbar level. Grade 1 L5 anterolisthesis. Bilateral L5 spondylolysis. No compression deformities. Mild anterior wedge compression deformity at the L1 level. This could be an acute finding and this could be evaluated further with MRI. No osteolytic or osteoblastic lesions identified.   Severity of spondylolisthesis increases with flexion and extension consistent with instability.   IMPRESSION: 1. 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 2. Multilevel degenerative disc disease. 3. L1 mild compression deformity that is age-indeterminate. 4. Consider MRI correlation.     Electronically Signed   By: Layla Maw M.D.   On: 01/01/2023 08:34  I have personally reviewed the images and agree with the above interpretation.  Message sent to patient regarding results. I don't think L1 compression is acute. This does not fit with his symptoms or exam.   ADDENDUM 01/06/23:  See PT note scanned under media. Patient cancelled further PT due to difficulty with copays.    Drake Leach PA-C Dept. of Neurosurgery

## 2022-12-21 DIAGNOSIS — M5442 Lumbago with sciatica, left side: Secondary | ICD-10-CM | POA: Insufficient documentation

## 2022-12-21 NOTE — Assessment & Plan Note (Signed)
Setting up for referral to neurosurgery given his continuous issues.  Also sending celebrex to see if this is helpful for his pain.   Will recheck at follow up.

## 2022-12-21 NOTE — Assessment & Plan Note (Signed)
Setting up referral to Ortho for patinet. Will defer to them for further treatment changes.   Recheck at follow up.

## 2022-12-21 NOTE — Assessment & Plan Note (Signed)
Patient educated on foods that contain carbohydrates and the need to decrease intake.  We discussed prediabetes, and what it means and the need for strict dietary control to prevent progression to type 2 diabetes.  Advised to decrease intake of sugary drinks, including sodas, sweet tea, and some juices, and of starch and sugar heavy foods (ie., potatoes, rice, bread, pasta, desserts). He verbalizes understanding and agreement with the changes discussed today.   A1C Continues to be in prediabetic ranges.  Will reassess at follow up after next lab check.  Patient counseled on dietary choices and verbalized understanding.   

## 2022-12-21 NOTE — Assessment & Plan Note (Signed)
Sending some refills for his blood pressure medications.  Will reassess at follow up to see about increasing his dosing.

## 2022-12-21 NOTE — Assessment & Plan Note (Signed)
Checking labs today.  Continue current therapy for lipid control. Will modify as needed based on labwork results.  

## 2022-12-23 ENCOUNTER — Ambulatory Visit: Payer: BC Managed Care – PPO | Admitting: Orthopedic Surgery

## 2022-12-23 ENCOUNTER — Ambulatory Visit
Admission: RE | Admit: 2022-12-23 | Discharge: 2022-12-23 | Disposition: A | Discharge status: Home / Self Care | Payer: BC Managed Care – PPO | Source: Ambulatory Visit | Attending: Orthopedic Surgery | Admitting: Orthopedic Surgery

## 2022-12-23 ENCOUNTER — Ambulatory Visit
Admission: RE | Admit: 2022-12-23 | Discharge: 2022-12-23 | Disposition: A | Discharge status: Home / Self Care | Payer: Self-pay | Source: Ambulatory Visit | Attending: Orthopedic Surgery | Admitting: Orthopedic Surgery

## 2022-12-23 ENCOUNTER — Encounter: Payer: Self-pay | Admitting: Orthopedic Surgery

## 2022-12-23 ENCOUNTER — Other Ambulatory Visit: Payer: Self-pay

## 2022-12-23 ENCOUNTER — Ambulatory Visit
Admission: RE | Admit: 2022-12-23 | Discharge: 2022-12-23 | Disposition: A | Discharge status: Home / Self Care | Payer: BC Managed Care – PPO | Attending: Orthopedic Surgery | Admitting: Orthopedic Surgery

## 2022-12-23 VITALS — BP 130/89 | Ht 70.0 in | Wt 224.4 lb

## 2022-12-23 DIAGNOSIS — M4316 Spondylolisthesis, lumbar region: Secondary | ICD-10-CM | POA: Diagnosis present

## 2022-12-23 DIAGNOSIS — M4726 Other spondylosis with radiculopathy, lumbar region: Secondary | ICD-10-CM

## 2022-12-23 DIAGNOSIS — M5416 Radiculopathy, lumbar region: Secondary | ICD-10-CM | POA: Diagnosis present

## 2022-12-23 DIAGNOSIS — M47816 Spondylosis without myelopathy or radiculopathy, lumbar region: Secondary | ICD-10-CM | POA: Insufficient documentation

## 2022-12-23 DIAGNOSIS — Z049 Encounter for examination and observation for unspecified reason: Secondary | ICD-10-CM

## 2022-12-23 DIAGNOSIS — M5137 Other intervertebral disc degeneration, lumbosacral region: Secondary | ICD-10-CM

## 2022-12-23 DIAGNOSIS — M5136 Other intervertebral disc degeneration, lumbar region: Secondary | ICD-10-CM

## 2022-12-23 NOTE — Patient Instructions (Signed)
It was so nice to see you today. Thank you so much for coming in.    You have some wear and tear in your back and this is likely what is causing your pain.   I ordered xrays of your lower back. You can get these at [facility] (building with the white pillars) off of [address]. The address is [address] [place], [place] [address]. You do not need any appointment.   I sent physical therapy orders to [facility]. You can call them at [phone] if you don't hear from them to schedule your visit.   I will see you back in 6-8 weeks. Please do not hesitate to call if you have any questions or concerns. You can also message me in MyChart.   Drake Leach PA-C [phone]

## 2022-12-25 ENCOUNTER — Ambulatory Visit: Payer: BC Managed Care – PPO | Admitting: Family

## 2022-12-25 ENCOUNTER — Encounter: Payer: Self-pay | Admitting: Family

## 2022-12-25 VITALS — BP 140/110 | HR 101 | Ht 70.0 in | Wt 224.6 lb

## 2022-12-25 DIAGNOSIS — E559 Vitamin D deficiency, unspecified: Secondary | ICD-10-CM

## 2022-12-25 DIAGNOSIS — E782 Mixed hyperlipidemia: Secondary | ICD-10-CM | POA: Diagnosis not present

## 2022-12-25 DIAGNOSIS — I10 Essential (primary) hypertension: Secondary | ICD-10-CM

## 2022-12-25 DIAGNOSIS — R7303 Prediabetes: Secondary | ICD-10-CM

## 2022-12-25 MED ORDER — VITAMIN D (ERGOCALCIFEROL) 1.25 MG (50000 UNIT) PO CAPS: 50000.0000 [IU] | ORAL_CAPSULE | ORAL | 1 refills | Status: AC

## 2022-12-25 MED ORDER — AMLODIPINE BESYLATE 10 MG PO TABS: 10.0000 mg | ORAL_TABLET | Freq: Every day | ORAL | 11 refills | Status: AC

## 2022-12-25 NOTE — Progress Notes (Signed)
Established Patient Office Visit  Subjective:  Patient ID: Anthony Stewart, male    DOB: 10/06/1975  Age: 47 y.o. MRN: [id number]  Chief Complaint  Patient presents with   Follow-up    2 weeks    Pt. Here today for 2 week n/p follow up.   Had labs done at that time, so we will review in detail today.  Labs: LDL and Triglycerides were elevated, A1C is Prediabetic ranges, and Vitamin D is low.   BP is still elevated at this time, though it is better than when he was here previously.  He has also seen the Neurosurgeon, and is going to see physical therapy to fulfill the insurance requirements that he do so.   No other concerns today.      No other concerns at this time.   Past Medical History:  Diagnosis Date   Hypertension     No past surgical history on file.  Social History   Socioeconomic History   Marital status: Married    Spouse name: Not on file   Number of children: Not on file   Years of education: Not on file   Highest education level: Not on file  Occupational History   Not on file  Tobacco Use   Smoking status: Every Day    Types: Cigarettes   Smokeless tobacco: Never   Tobacco comments:    1 pack a week   Vaping Use   Vaping status: Never Used  Substance and Sexual Activity   Alcohol use: Yes    Alcohol/week: 16.0 standard drinks of alcohol    Types: 16 Cans of beer per week    Comment: 4-6 beers per night.   Drug use: No   Sexual activity: Not on file  Other Topics Concern   Not on file  Social History Narrative   Lives in [place] with wife.      Work - Nutritional therapist      Social Determinants of Corporate investment banker Strain: Not on [employer] Insecurity: Not on file  Transportation Needs: Not on file  Physical Activity: Not on file  Stress: Not on file  Social Connections: Not on file  Intimate Partner Violence: Not on file    Family History  Problem Relation Age of Onset   Healthy Mother     No Known Allergies  Review of  Systems  Musculoskeletal:  Positive for back pain and joint pain.  All other systems reviewed and are negative.      Objective:   BP (!) 140/110   Pulse (!) 101   Ht 5\' 10"  (1.778 m)   Wt 224 lb 9.6 oz (101.9 kg)   SpO2 98%   BMI 32.23 kg/m   Vitals:   12/25/22 1009  BP: (!) 140/110  Pulse: (!) 101  Height: 5\' 10"  (1.778 m)  Weight: 224 lb 9.6 oz (101.9 kg)  SpO2: 98%  BMI (Calculated): 32.23    Physical Exam Vitals and nursing note reviewed.  Constitutional:      Appearance: Normal appearance. He is normal weight.  Eyes:     Pupils: Pupils are equal, round, and reactive to light.  Cardiovascular:     Rate and Rhythm: Normal rate and regular rhythm.     Pulses: Normal pulses.     Heart sounds: Normal heart sounds.  Pulmonary:     Effort: Pulmonary effort is normal.     Breath sounds: Normal breath sounds.  Musculoskeletal:  Lumbar back: Tenderness present. Positive right straight leg raise test and positive left straight leg raise test.  Neurological:     General: No focal deficit present.     Mental Status: He is alert and oriented to person, place, and time. Mental status is at baseline.  Psychiatric:        Mood and Affect: Mood normal.        Behavior: Behavior normal.        Thought Content: Thought content normal.        Judgment: Judgment normal.      No results found for any visits on 12/25/22.  Recent Results (from the past 2160 hour(s))  Lipid panel     Status: Abnormal   Collection Time: 12/09/22 11:02 AM  Result Value Ref Range   Cholesterol, Total 239 (H) 100 - 199 mg/dL   Triglycerides 962 (H) 0 - 149 mg/dL   HDL 53 >95 mg/dL   VLDL Cholesterol Cal 48 (H) 5 - 40 mg/dL   LDL Chol Calc (NIH) 284 (H) 0 - 99 mg/dL   Chol/HDL Ratio 4.5 0.0 - 5.0 ratio    Comment:                                   T. Chol/HDL Ratio                                             Men  Women                               1/2 Avg.Risk  3.4    3.3                                    Avg.Risk  5.0    4.4                                2X Avg.Risk  9.6    7.1                                3X Avg.Risk 23.4   11.0   VITAMIN D 25 Hydroxy (Vit-D Deficiency, Fractures)     Status: Abnormal   Collection Time: 12/09/22 11:02 AM  Result Value Ref Range   Vit D, 25-Hydroxy 13.1 (L) 30.0 - 100.0 ng/mL    Comment: Vitamin D deficiency has been defined by the Institute of Medicine and an Endocrine Society practice guideline as a level of serum 25-OH vitamin D less than 20 ng/mL (1,2). The Endocrine Society went on to further define vitamin D insufficiency as a level between 21 and 29 ng/mL (2). 1. IOM (Institute of Medicine). 2010. Dietary reference    intakes for calcium and D. [place]: The    [employer]. 2. Holick MF, Binkley [place], Bischoff-Ferrari HA, et al.    Evaluation, treatment, and prevention of vitamin D    deficiency: an Endocrine Society clinical practice    guideline. JCEM. 2011 Jul; 96(7):1911-30.   CMP14+EGFR     Status: Abnormal  Collection Time: 12/09/22 11:02 AM  Result Value Ref Range   Glucose 127 (H) 70 - 99 mg/dL   BUN 11 6 - 24 mg/dL   Creatinine, Ser 7.82 0.76 - 1.27 mg/dL   eGFR 956 >21 HY/QMV/7.84   BUN/Creatinine Ratio 12 9 - 20   Sodium 139 134 - 144 mmol/L   Potassium 4.2 3.5 - 5.2 mmol/L   Chloride 100 96 - 106 mmol/L   CO2 24 20 - 29 mmol/L   Calcium 10.4 (H) 8.7 - 10.2 mg/dL   Total Protein 7.3 6.0 - 8.5 g/dL   Albumin 4.5 4.1 - 5.1 g/dL   Globulin, Total 2.8 1.5 - 4.5 g/dL   Bilirubin Total 0.6 0.0 - 1.2 mg/dL   Alkaline Phosphatase 82 44 - 121 IU/L   AST 29 0 - 40 IU/L   ALT 28 0 - 44 IU/L  TSH     Status: None   Collection Time: 12/09/22 11:02 AM  Result Value Ref Range   TSH 1.090 0.450 - 4.500 uIU/mL  Hemoglobin A1c     Status: Abnormal   Collection Time: 12/09/22 11:02 AM  Result Value Ref Range   Hgb A1c MFr Bld 6.3 (H) 4.8 - 5.6 %    Comment:          Prediabetes: 5.7 - 6.4           Diabetes: >6.4          Glycemic control for adults with diabetes: <7.0    Est. average glucose Bld gHb Est-mCnc 134 mg/dL  Vitamin O96     Status: None   Collection Time: 12/09/22 11:02 AM  Result Value Ref Range   Vitamin B-12 581 232 - 1,245 pg/mL       Assessment & Plan:   Problem List Items Addressed This Visit       Active Problems   Essential hypertension, benign - Primary    Blood pressure well controlled with current medications.  Continue current therapy.  Will reassess at follow up.        Relevant Medications   amLODipine (NORVASC) 10 MG tablet   Mixed hyperlipidemia    Checking labs today.  Continue current therapy for lipid control. Will modify as needed based on labwork results.        Relevant Medications   amLODipine (NORVASC) 10 MG tablet   Prediabetes    A1C Continues to be in prediabetic ranges.  Will reassess at follow up after next lab check.  Patient counseled on dietary choices and verbalized understanding.        Vitamin D deficiency, unspecified    Checking labs today.  Will continue supplements as needed.        Return in about 1 month (around 01/24/2023) for F/U.   Total time spent: 20 minutes  Miki Kins, FNP  12/25/2022   This document may have been prepared by [facility] Voice Recognition software and as such may include unintentional dictation errors.

## 2022-12-25 NOTE — Assessment & Plan Note (Signed)
Blood pressure well controlled with current medications.  Continue current therapy.  Will reassess at follow up.  

## 2022-12-25 NOTE — Assessment & Plan Note (Signed)
Checking labs today.  Continue current therapy for lipid control. Will modify as needed based on labwork results.  

## 2022-12-25 NOTE — Assessment & Plan Note (Signed)
Checking labs today.  Will continue supplements as needed.  

## 2022-12-25 NOTE — Assessment & Plan Note (Signed)
A1C Continues to be in prediabetic ranges.  Will reassess at follow up after next lab check.  Patient counseled on dietary choices and verbalized understanding.   

## 2023-02-14 NOTE — Progress Notes (Deleted)
Referring Physician:  Miki Kins, FNP [address] [place],  [place] [address]  Primary Physician:  Miki Kins, FNP  History of Present Illness: 02/14/2023 [name] has a history of HTN, hyperlipidemia.   Last seen by me on 12/23/22 for LBP and bilateral leg pain. Imaging from 2021 shows slip at L5-S1 with DDD and severe right/mild left foraminal stenosis. Also with lumbar spondylosis.  Lumbar xrays after his visit showed slip at L5-S1 with instability.   PT recommended and he was unable to complete for financial reasons. He was seen by [facility] and referred for lumbar injections. They also started him on neurontin and zanaflex.   He is here for follow up.    ***Repeat xrays showed instability at L5-S1?    Has seen PCP for upper back pain that started after heavy lifting at work. He was given motrin and zanaflex.   He has constant lower back pain with bilateral lateral leg pain to his calf for the last year or so. Right leg pain = left leg pain. He has stiffness and soreness in his back. No specific aggravating or alleviating factors. No numbness, tingling. He notes some weakness in both legs.   Bowel/Bladder Dysfunction: none  He smokes 1 pack per week x 8 months.   Conservative measures:  Physical therapy: unable to do secondary to copay Multimodal medical therapy including regular antiinflammatories: motrin, zanaflex, celebrex.  Injections: No epidural steroid injections  Past Surgery: no spinal surgery  Anthony Stewart has no symptoms of cervical myelopathy.  The symptoms are causing a significant impact on the patient's life.   Review of Systems:  A 10 point review of systems is negative, except for the pertinent positives and negatives detailed in the HPI.  Past Medical History: Past Medical History:  Diagnosis Date   Hypertension     Past Surgical History: No past surgical history on file.  Allergies: Allergies as of 02/17/2023    (No Known Allergies)    Medications: Outpatient Encounter Medications as of 02/17/2023  Medication Sig   amLODipine (NORVASC) 10 MG tablet Take 1 tablet (10 mg total) by mouth daily.   Blood Pressure Monitoring (WRIST BLOOD PRESSURE MONITOR) MISC 1 each by Does not apply route as needed (Check blood pressure daily).   celecoxib (CELEBREX) 100 MG capsule Take 1 capsule (100 mg total) by mouth 2 (two) times daily.   ketoconazole (NIZORAL) 200 MG tablet Take 1 tablet (200 mg total) by mouth daily.   losartan (COZAAR) 50 MG tablet Take 1 tablet (50 mg total) by mouth daily.   Polyethyl Glycol-Propyl Glycol (SYSTANE) 0.4-0.3 % SOLN Apply 1 drop to eye 4 (four) times daily as needed. (Patient not taking: Reported on 12/09/2022)   Vitamin D, Ergocalciferol, (DRISDOL) 1.25 MG (50000 UNIT) CAPS capsule Take 1 capsule (50,000 Units total) by mouth every 7 (seven) days.   No facility-administered encounter medications on file as of 02/17/2023.    Social History: Social History   Tobacco Use   Smoking status: Every Day    Types: Cigarettes   Smokeless tobacco: Never   Tobacco comments:    1 pack a week   Vaping Use   Vaping status: Never Used  Substance Use Topics   Alcohol use: Yes    Alcohol/week: 16.0 standard drinks of alcohol    Types: 16 Cans of beer per week    Comment: 4-6 beers per night.   Drug use: No    Family Medical History:  Family History  Problem Relation Age of Onset   Healthy Mother     Physical Examination: There were no vitals filed for this visit.    Awake, alert, oriented to person, place, and time.  Speech is clear and fluent. Fund of knowledge is appropriate.   Cranial Nerves: Pupils equal round and reactive to light.  Facial tone is symmetric.    No posterior lumbar tenderness.   No abnormal lesions on exposed skin.   Strength: Side Biceps Triceps Deltoid Interossei Grip Wrist Ext. Wrist Flex.  R 5 5 5 5 5 5 5   L 5 5 5 5 5 5 5    Side Iliopsoas  Quads Hamstring PF DF EHL  R 5 5 5 5 5 5   L 5 5 5 5 5 5    Reflexes are 2+ and symmetric at the biceps, brachioradialis, patella.   Hoffman's is absent.  Clonus is not present.   Bilateral upper and lower extremity sensation is intact to light touch.     No pain with IR/ER of his hips bilaterally.   Gait is normal.     Medical Decision Making  Imaging: none   Assessment and Plan: Anthony Stewart is a pleasant 47 y.o. male who has constant lower back pain with bilateral lateral leg pain to his calf for the last year or so. Right leg pain = left leg pain. No numbness, tingling. He notes some weakness in both legs.   Imaging from 2021 shows slip at L5-S1 with DDD and severe right/mild left foraminal stenosis. Also with lumbar spondylosis.   LBP likely multifactorial, but leg pain and most of his symptoms are likely from L5-S1.   Treatment options discussed with patient and following plan made:   - Lumbar xrays ordered. Will message him with results.  - Order for physical therapy for lumbar spine to [facility]. Patient to call to schedule appointment.  - Discussed getting updated lumbar MRI. He is not interested in any injections at this point so will hold off. Will revisit if no improvement with PT.  - Follow up with me in 6-8 weeks and prn.   I spent a total of 30 minutes in face-to-face and non-face-to-face activities related to this patient's care today including review of outside records, review of imaging, review of symptoms, physical exam, discussion of differential diagnosis, discussion of treatment options, and documentation.   Thank you for involving me in the care of this patient.   ADDENDUM 01/01/23:  Lumbar xrays dated 12/23/22:  FINDINGS: Disc space narrowing with marginal osteophytes at each lumbar level. Grade 1 L5 anterolisthesis. Bilateral L5 spondylolysis. No compression deformities. Mild anterior wedge compression deformity at the L1 level. This could be an acute  finding and this could be evaluated further with MRI. No osteolytic or osteoblastic lesions identified.   Severity of spondylolisthesis increases with flexion and extension consistent with instability.   IMPRESSION: 1. 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 2. Multilevel degenerative disc disease. 3. L1 mild compression deformity that is age-indeterminate. 4. Consider MRI correlation.     Electronically Signed   By: Layla Maw M.D.   On: 01/01/2023 08:34  I have personally reviewed the images and agree with the above interpretation.  Message sent to patient regarding results. I don't think L1 compression is acute. This does not fit with his symptoms or exam.   ADDENDUM 01/06/23:  See PT note scanned under media. Patient cancelled further PT due to difficulty with copays.  Drake Leach PA-C Dept. of Neurosurgery

## 2023-02-17 ENCOUNTER — Ambulatory Visit: Payer: BC Managed Care – PPO | Admitting: Orthopedic Surgery

## 2023-02-23 ENCOUNTER — Encounter: Payer: Self-pay | Admitting: Orthopedic Surgery
